# Patient Record
Sex: Male | Born: 1980 | Race: White | Hispanic: No | Marital: Single | Smoking: Current every day smoker
Health system: Southern US, Community
[De-identification: ages and names within clinical notes are randomized; demographics above are authoritative.]

## PROBLEM LIST (undated history)

## (undated) DIAGNOSIS — F4310 Post-traumatic stress disorder, unspecified: Secondary | ICD-10-CM

## (undated) DIAGNOSIS — F319 Bipolar disorder, unspecified: Secondary | ICD-10-CM

## (undated) DIAGNOSIS — G8929 Other chronic pain: Secondary | ICD-10-CM

## (undated) DIAGNOSIS — W3400XA Accidental discharge from unspecified firearms or gun, initial encounter: Secondary | ICD-10-CM

## (undated) DIAGNOSIS — F419 Anxiety disorder, unspecified: Secondary | ICD-10-CM

## (undated) DIAGNOSIS — R45851 Suicidal ideations: Secondary | ICD-10-CM

## (undated) DIAGNOSIS — M549 Dorsalgia, unspecified: Secondary | ICD-10-CM

## (undated) DIAGNOSIS — Y249XXA Unspecified firearm discharge, undetermined intent, initial encounter: Secondary | ICD-10-CM

## (undated) HISTORY — PX: BACK SURGERY: SHX140

## (undated) HISTORY — PX: ABDOMINAL SURGERY: SHX537

---

## 2002-03-25 ENCOUNTER — Emergency Department (HOSPITAL_COMMUNITY): Admission: EM | Admit: 2002-03-25 | Discharge: 2002-03-25 | Payer: Self-pay | Admitting: *Deleted

## 2003-01-28 ENCOUNTER — Emergency Department (HOSPITAL_COMMUNITY): Admission: EM | Admit: 2003-01-28 | Discharge: 2003-01-28 | Payer: Self-pay | Admitting: Emergency Medicine

## 2003-05-20 ENCOUNTER — Emergency Department (HOSPITAL_COMMUNITY): Admission: EM | Admit: 2003-05-20 | Discharge: 2003-05-20 | Payer: Self-pay | Admitting: Emergency Medicine

## 2003-07-07 ENCOUNTER — Emergency Department (HOSPITAL_COMMUNITY): Admission: EM | Admit: 2003-07-07 | Discharge: 2003-07-07 | Payer: Self-pay | Admitting: Emergency Medicine

## 2007-07-17 ENCOUNTER — Emergency Department (HOSPITAL_COMMUNITY): Admission: EM | Admit: 2007-07-17 | Discharge: 2007-07-17 | Payer: Self-pay | Admitting: Emergency Medicine

## 2007-09-28 ENCOUNTER — Emergency Department (HOSPITAL_COMMUNITY): Admission: EM | Admit: 2007-09-28 | Discharge: 2007-09-28 | Payer: Self-pay | Admitting: Emergency Medicine

## 2007-10-19 ENCOUNTER — Emergency Department (HOSPITAL_COMMUNITY): Admission: EM | Admit: 2007-10-19 | Discharge: 2007-10-19 | Payer: Self-pay | Admitting: Emergency Medicine

## 2010-08-16 ENCOUNTER — Emergency Department: Payer: Self-pay | Admitting: Emergency Medicine

## 2011-04-16 ENCOUNTER — Emergency Department (HOSPITAL_COMMUNITY)
Admission: EM | Admit: 2011-04-16 | Discharge: 2011-04-16 | Disposition: A | Discharge status: Home / Self Care | Payer: Self-pay | Attending: Emergency Medicine | Admitting: Emergency Medicine

## 2011-04-16 ENCOUNTER — Emergency Department (HOSPITAL_COMMUNITY): Payer: Self-pay

## 2011-04-16 ENCOUNTER — Encounter: Payer: Self-pay | Admitting: Emergency Medicine

## 2011-04-16 DIAGNOSIS — F17200 Nicotine dependence, unspecified, uncomplicated: Secondary | ICD-10-CM | POA: Insufficient documentation

## 2011-04-16 DIAGNOSIS — G5600 Carpal tunnel syndrome, unspecified upper limb: Secondary | ICD-10-CM | POA: Insufficient documentation

## 2011-04-16 MED ORDER — HYDROCODONE-ACETAMINOPHEN 5-325 MG PO TABS: 1.0000 | ORAL_TABLET | ORAL | Status: AC | PRN

## 2011-04-16 MED ORDER — NAPROXEN 500 MG PO TABS: 500.0000 mg | ORAL_TABLET | Freq: Two times a day (BID) | ORAL | Status: DC
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
## 2011-04-16 NOTE — ED Notes (Signed)
Pt states having tingling and numbness in right hand for three to four days. Pt works with hands a lot.

## 2011-04-16 NOTE — ED Notes (Signed)
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 Rt wrist with noted swelling and last 2 fingers blanching at times.  Pt states left wrist is bothersome at times however not at this time.

## 2011-04-26 NOTE — ED Provider Notes (Signed)
Medical screening examination/treatment/procedure(s) were performed by non-physician practitioner and as supervising physician I was immediately available for consultation/collaboration.  Shelda Jakes, MD 04/26/11 [phone]

## 2011-09-18 ENCOUNTER — Emergency Department (HOSPITAL_COMMUNITY)
Admission: EM | Admit: 2011-09-18 | Discharge: 2011-09-18 | Disposition: A | Discharge status: Home / Self Care | Payer: Self-pay | Attending: Emergency Medicine | Admitting: Emergency Medicine

## 2011-09-18 ENCOUNTER — Encounter (HOSPITAL_COMMUNITY): Payer: Self-pay

## 2011-09-18 ENCOUNTER — Emergency Department (HOSPITAL_COMMUNITY): Payer: Self-pay

## 2011-09-18 DIAGNOSIS — R109 Unspecified abdominal pain: Secondary | ICD-10-CM | POA: Insufficient documentation

## 2011-09-18 DIAGNOSIS — M545 Low back pain, unspecified: Secondary | ICD-10-CM | POA: Insufficient documentation

## 2011-09-18 DIAGNOSIS — Z9889 Other specified postprocedural states: Secondary | ICD-10-CM | POA: Insufficient documentation

## 2011-09-18 DIAGNOSIS — M549 Dorsalgia, unspecified: Secondary | ICD-10-CM

## 2011-09-18 DIAGNOSIS — F17200 Nicotine dependence, unspecified, uncomplicated: Secondary | ICD-10-CM | POA: Insufficient documentation

## 2011-09-18 DIAGNOSIS — G8929 Other chronic pain: Secondary | ICD-10-CM | POA: Insufficient documentation

## 2011-09-18 HISTORY — DX: Accidental discharge from unspecified firearms or gun, initial encounter: W34.00XA

## 2011-09-18 HISTORY — DX: Unspecified firearm discharge, undetermined intent, initial encounter: Y24.9XXA

## 2011-09-18 MED ORDER — OXYCODONE-ACETAMINOPHEN 5-325 MG PO TABS: ORAL_TABLET | ORAL | Status: DC

## 2011-09-18 MED ORDER — METHOCARBAMOL 500 MG PO TABS
1000.0000 mg | ORAL_TABLET | Freq: Once | ORAL | Status: AC
  Administered 2011-09-18: 1000 mg via ORAL
  Filled 2011-09-18: qty 2

## 2011-09-18 MED ORDER — OXYCODONE-ACETAMINOPHEN 5-325 MG PO TABS
2.0000 | ORAL_TABLET | Freq: Once | ORAL | Status: AC
  Administered 2011-09-18: 2 via ORAL
  Filled 2011-09-18: qty 2

## 2011-09-18 MED ORDER — METHOCARBAMOL 500 MG PO TABS: ORAL_TABLET | ORAL | Status: DC
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
## 2011-09-18 NOTE — Discharge Instructions (Signed)
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
## 2011-09-21 ENCOUNTER — Other Ambulatory Visit (HOSPITAL_COMMUNITY): Payer: Self-pay | Admitting: Emergency Medicine

## 2011-09-21 ENCOUNTER — Other Ambulatory Visit (HOSPITAL_COMMUNITY): Payer: Self-pay | Admitting: Diagnostic Radiology

## 2011-09-21 DIAGNOSIS — R52 Pain, unspecified: Secondary | ICD-10-CM

## 2012-04-07 ENCOUNTER — Emergency Department (HOSPITAL_COMMUNITY): Payer: Self-pay

## 2012-04-07 ENCOUNTER — Emergency Department (HOSPITAL_COMMUNITY)
Admission: EM | Admit: 2012-04-07 | Discharge: 2012-04-07 | Disposition: A | Discharge status: Home / Self Care | Payer: Self-pay | Attending: Emergency Medicine | Admitting: Emergency Medicine

## 2012-04-07 ENCOUNTER — Encounter (HOSPITAL_COMMUNITY): Payer: Self-pay | Admitting: Emergency Medicine

## 2012-04-07 DIAGNOSIS — F411 Generalized anxiety disorder: Secondary | ICD-10-CM | POA: Insufficient documentation

## 2012-04-07 DIAGNOSIS — Z888 Allergy status to other drugs, medicaments and biological substances status: Secondary | ICD-10-CM | POA: Insufficient documentation

## 2012-04-07 DIAGNOSIS — M549 Dorsalgia, unspecified: Secondary | ICD-10-CM | POA: Insufficient documentation

## 2012-04-07 DIAGNOSIS — F17200 Nicotine dependence, unspecified, uncomplicated: Secondary | ICD-10-CM | POA: Insufficient documentation

## 2012-04-07 DIAGNOSIS — Z880 Allergy status to penicillin: Secondary | ICD-10-CM | POA: Insufficient documentation

## 2012-04-07 DIAGNOSIS — R5381 Other malaise: Secondary | ICD-10-CM | POA: Insufficient documentation

## 2012-04-07 DIAGNOSIS — R5383 Other fatigue: Secondary | ICD-10-CM

## 2012-04-07 DIAGNOSIS — Z881 Allergy status to other antibiotic agents status: Secondary | ICD-10-CM | POA: Insufficient documentation

## 2012-04-07 HISTORY — DX: Anxiety disorder, unspecified: F41.9

## 2012-04-07 LAB — BASIC METABOLIC PANEL
BUN: 16 mg/dL (ref 6–23)
CO2: 25 mEq/L (ref 19–32)
Calcium: 9.7 mg/dL (ref 8.4–10.5)
Chloride: 101 mEq/L (ref 96–112)
Creatinine, Ser: 1.15 mg/dL (ref 0.50–1.35)
GFR calc Af Amer: >90 mL/min (ref >90)
GFR calc non Af Amer: 83 mL/min — ABNORMAL LOW (ref >90)
Glucose, Bld: 110 mg/dL — ABNORMAL HIGH (ref 70–99)
Potassium: 4.2 mEq/L (ref 3.5–5.1)
Sodium: 136 mEq/L (ref 135–145)

## 2012-04-07 LAB — CBC WITH DIFFERENTIAL/PLATELET
Basophils Absolute: 0.1 10*3/uL (ref 0.0–0.1)
Basophils Relative: 1 % (ref 0–1)
Eosinophils Absolute: 0.2 10*3/uL (ref 0.0–0.7)
Eosinophils Relative: 2 % (ref 0–5)
HCT: 47.9 % (ref 39.0–52.0)
Hemoglobin: 17.1 g/dL — ABNORMAL HIGH (ref 13.0–17.0)
Lymphocytes Relative: 26 % (ref 12–46)
Lymphs Abs: 2.7 10*3/uL (ref 0.7–4.0)
MCH: 32.3 pg (ref 26.0–34.0)
MCHC: 35.7 g/dL (ref 30.0–36.0)
MCV: 90.5 fL (ref 78.0–100.0)
Monocytes Absolute: 0.9 10*3/uL (ref 0.1–1.0)
Monocytes Relative: 9 % (ref 3–12)
Neutro Abs: 6.4 10*3/uL (ref 1.7–7.7)
Neutrophils Relative %: 63 % (ref 43–77)
Platelets: 264 10*3/uL (ref 150–400)
RBC: 5.29 MIL/uL (ref 4.22–5.81)
RDW: 13.3 % (ref 11.5–15.5)
WBC: 10.2 10*3/uL (ref 4.0–10.5)

## 2012-04-07 LAB — URINALYSIS, ROUTINE W REFLEX MICROSCOPIC
Bilirubin Urine: NEGATIVE
Glucose, UA: NEGATIVE mg/dL
Hgb urine dipstick: NEGATIVE
Ketones, ur: NEGATIVE mg/dL
Leukocytes, UA: NEGATIVE
Nitrite: NEGATIVE
Protein, ur: NEGATIVE mg/dL
Specific Gravity, Urine: 1.02 (ref 1.005–1.030)
Urobilinogen, UA: 0.2 mg/dL (ref 0.0–1.0)
pH: 6.5 (ref 5.0–8.0)
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
## 2012-04-07 NOTE — ED Provider Notes (Signed)
Medical screening examination/treatment/procedure(s) were performed by non-physician practitioner and as supervising physician I was immediately available for consultation/collaboration.   Assyria Morreale W Ruthann Angulo, MD 04/07/12 1630 
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
## 2013-01-12 ENCOUNTER — Emergency Department (HOSPITAL_COMMUNITY): Payer: Self-pay

## 2013-01-12 ENCOUNTER — Encounter (HOSPITAL_COMMUNITY): Payer: Self-pay | Admitting: Emergency Medicine

## 2013-01-12 ENCOUNTER — Emergency Department (HOSPITAL_COMMUNITY)
Admission: EM | Admit: 2013-01-12 | Discharge: 2013-01-12 | Disposition: A | Discharge status: Home / Self Care | Payer: Self-pay | Attending: Emergency Medicine | Admitting: Emergency Medicine

## 2013-01-12 DIAGNOSIS — X500XXA Overexertion from strenuous movement or load, initial encounter: Secondary | ICD-10-CM | POA: Insufficient documentation

## 2013-01-12 DIAGNOSIS — F4310 Post-traumatic stress disorder, unspecified: Secondary | ICD-10-CM | POA: Insufficient documentation

## 2013-01-12 DIAGNOSIS — Y9289 Other specified places as the place of occurrence of the external cause: Secondary | ICD-10-CM | POA: Insufficient documentation

## 2013-01-12 DIAGNOSIS — G8929 Other chronic pain: Secondary | ICD-10-CM | POA: Insufficient documentation

## 2013-01-12 DIAGNOSIS — F17200 Nicotine dependence, unspecified, uncomplicated: Secondary | ICD-10-CM | POA: Insufficient documentation

## 2013-01-12 DIAGNOSIS — Z87828 Personal history of other (healed) physical injury and trauma: Secondary | ICD-10-CM | POA: Insufficient documentation

## 2013-01-12 DIAGNOSIS — Z9889 Other specified postprocedural states: Secondary | ICD-10-CM | POA: Insufficient documentation

## 2013-01-12 DIAGNOSIS — Z880 Allergy status to penicillin: Secondary | ICD-10-CM | POA: Insufficient documentation

## 2013-01-12 DIAGNOSIS — T148XXA Other injury of unspecified body region, initial encounter: Secondary | ICD-10-CM

## 2013-01-12 DIAGNOSIS — Y9389 Activity, other specified: Secondary | ICD-10-CM | POA: Insufficient documentation

## 2013-01-12 DIAGNOSIS — Z79899 Other long term (current) drug therapy: Secondary | ICD-10-CM | POA: Insufficient documentation

## 2013-01-12 DIAGNOSIS — S239XXA Sprain of unspecified parts of thorax, initial encounter: Secondary | ICD-10-CM | POA: Insufficient documentation

## 2013-01-12 DIAGNOSIS — F411 Generalized anxiety disorder: Secondary | ICD-10-CM | POA: Insufficient documentation

## 2013-01-12 HISTORY — DX: Post-traumatic stress disorder, unspecified: F43.10

## 2013-01-12 HISTORY — DX: Dorsalgia, unspecified: M54.9

## 2013-01-12 HISTORY — DX: Other chronic pain: G89.29

## 2013-01-12 MED ORDER — DIAZEPAM 5 MG PO TABS
5.0000 mg | ORAL_TABLET | Freq: Once | ORAL | Status: AC
  Administered 2013-01-12: 5 mg via ORAL
  Filled 2013-01-12: qty 1

## 2013-01-12 MED ORDER — OXYCODONE-ACETAMINOPHEN 5-325 MG PO TABS: ORAL_TABLET | ORAL | Status: DC

## 2013-01-12 MED ORDER — METHOCARBAMOL 500 MG PO TABS: 1000.0000 mg | ORAL_TABLET | Freq: Four times a day (QID) | ORAL | Status: DC | PRN

## 2013-01-12 MED ORDER — NAPROXEN 250 MG PO TABS: 250.0000 mg | ORAL_TABLET | Freq: Two times a day (BID) | ORAL | Status: DC

## 2013-01-12 MED ORDER — HYDROMORPHONE HCL PF 1 MG/ML IJ SOLN
1.0000 mg | Freq: Once | INTRAMUSCULAR | Status: AC
  Administered 2013-01-12: 1 mg via INTRAMUSCULAR
  Filled 2013-01-12: qty 1

## 2013-01-12 NOTE — ED Notes (Signed)
Pain T spine region, Onset on Tuesday when moving a 45lbs flower pot . Pt shakey
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
## 2013-05-26 ENCOUNTER — Other Ambulatory Visit (HOSPITAL_COMMUNITY): Payer: Self-pay | Admitting: Family Medicine

## 2013-05-26 ENCOUNTER — Ambulatory Visit (HOSPITAL_COMMUNITY)
Admission: RE | Admit: 2013-05-26 | Discharge: 2013-05-26 | Disposition: A | Discharge status: Home / Self Care | Payer: Disability Insurance | Source: Ambulatory Visit | Attending: Family Medicine | Admitting: Family Medicine

## 2013-05-26 DIAGNOSIS — M542 Cervicalgia: Secondary | ICD-10-CM

## 2013-05-26 DIAGNOSIS — M25559 Pain in unspecified hip: Secondary | ICD-10-CM | POA: Insufficient documentation

## 2013-05-26 DIAGNOSIS — M545 Low back pain, unspecified: Secondary | ICD-10-CM | POA: Insufficient documentation

## 2013-05-26 DIAGNOSIS — M5137 Other intervertebral disc degeneration, lumbosacral region: Secondary | ICD-10-CM | POA: Insufficient documentation

## 2013-05-26 DIAGNOSIS — M51379 Other intervertebral disc degeneration, lumbosacral region without mention of lumbar back pain or lower extremity pain: Secondary | ICD-10-CM | POA: Insufficient documentation

## 2013-07-08 ENCOUNTER — Inpatient Hospital Stay (HOSPITAL_COMMUNITY)
Admission: AD | Admit: 2013-07-08 | Discharge: 2013-07-14 | DRG: 885 | Disposition: A | Discharge status: Home / Self Care | Payer: 59 | Source: Intra-hospital | Attending: Psychiatry | Admitting: Psychiatry

## 2013-07-08 ENCOUNTER — Encounter (HOSPITAL_COMMUNITY): Payer: Self-pay | Admitting: Emergency Medicine

## 2013-07-08 ENCOUNTER — Emergency Department (HOSPITAL_COMMUNITY)
Admission: EM | Admit: 2013-07-08 | Discharge: 2013-07-08 | Disposition: A | Discharge status: Other Acute Inpatient Hospital | Payer: Self-pay | Attending: Emergency Medicine | Admitting: Emergency Medicine

## 2013-07-08 ENCOUNTER — Encounter (HOSPITAL_COMMUNITY): Payer: Self-pay

## 2013-07-08 DIAGNOSIS — G4700 Insomnia, unspecified: Secondary | ICD-10-CM | POA: Diagnosis present

## 2013-07-08 DIAGNOSIS — F4310 Post-traumatic stress disorder, unspecified: Secondary | ICD-10-CM | POA: Insufficient documentation

## 2013-07-08 DIAGNOSIS — F10939 Alcohol use, unspecified with withdrawal, unspecified: Secondary | ICD-10-CM | POA: Diagnosis present

## 2013-07-08 DIAGNOSIS — F1910 Other psychoactive substance abuse, uncomplicated: Secondary | ICD-10-CM

## 2013-07-08 DIAGNOSIS — F1210 Cannabis abuse, uncomplicated: Secondary | ICD-10-CM | POA: Diagnosis present

## 2013-07-08 DIAGNOSIS — Z791 Long term (current) use of non-steroidal anti-inflammatories (NSAID): Secondary | ICD-10-CM | POA: Insufficient documentation

## 2013-07-08 DIAGNOSIS — R45851 Suicidal ideations: Secondary | ICD-10-CM | POA: Insufficient documentation

## 2013-07-08 DIAGNOSIS — F1010 Alcohol abuse, uncomplicated: Secondary | ICD-10-CM | POA: Diagnosis present

## 2013-07-08 DIAGNOSIS — Z87828 Personal history of other (healed) physical injury and trauma: Secondary | ICD-10-CM | POA: Insufficient documentation

## 2013-07-08 DIAGNOSIS — G8929 Other chronic pain: Secondary | ICD-10-CM | POA: Insufficient documentation

## 2013-07-08 DIAGNOSIS — F329 Major depressive disorder, single episode, unspecified: Secondary | ICD-10-CM | POA: Insufficient documentation

## 2013-07-08 DIAGNOSIS — Z79899 Other long term (current) drug therapy: Secondary | ICD-10-CM | POA: Insufficient documentation

## 2013-07-08 DIAGNOSIS — F319 Bipolar disorder, unspecified: Secondary | ICD-10-CM | POA: Diagnosis not present

## 2013-07-08 DIAGNOSIS — F3289 Other specified depressive episodes: Secondary | ICD-10-CM | POA: Insufficient documentation

## 2013-07-08 DIAGNOSIS — F411 Generalized anxiety disorder: Secondary | ICD-10-CM | POA: Insufficient documentation

## 2013-07-08 DIAGNOSIS — F1310 Sedative, hypnotic or anxiolytic abuse, uncomplicated: Secondary | ICD-10-CM | POA: Diagnosis present

## 2013-07-08 DIAGNOSIS — F332 Major depressive disorder, recurrent severe without psychotic features: Principal | ICD-10-CM | POA: Diagnosis present

## 2013-07-08 DIAGNOSIS — F10239 Alcohol dependence with withdrawal, unspecified: Secondary | ICD-10-CM | POA: Diagnosis present

## 2013-07-08 DIAGNOSIS — Z880 Allergy status to penicillin: Secondary | ICD-10-CM | POA: Insufficient documentation

## 2013-07-08 DIAGNOSIS — F17200 Nicotine dependence, unspecified, uncomplicated: Secondary | ICD-10-CM | POA: Insufficient documentation

## 2013-07-08 DIAGNOSIS — F10929 Alcohol use, unspecified with intoxication, unspecified: Secondary | ICD-10-CM

## 2013-07-08 DIAGNOSIS — F32A Depression, unspecified: Secondary | ICD-10-CM

## 2013-07-08 DIAGNOSIS — F1020 Alcohol dependence, uncomplicated: Secondary | ICD-10-CM | POA: Diagnosis present

## 2013-07-08 HISTORY — DX: Bipolar disorder, unspecified: F31.9

## 2013-07-08 LAB — ACETAMINOPHEN LEVEL: Acetaminophen (Tylenol), Serum: <15 ug/mL (ref 10–30)

## 2013-07-08 LAB — ETHANOL: ALCOHOL ETHYL (B): 141 mg/dL — AB (ref 0–11)

## 2013-07-08 LAB — CBC WITH DIFFERENTIAL/PLATELET
BASOS ABS: 0.1 10*3/uL (ref 0.0–0.1)
BASOS PCT: 1 % (ref 0–1)
EOS ABS: 0.3 10*3/uL (ref 0.0–0.7)
EOS PCT: 3 % (ref 0–5)
HEMATOCRIT: 45.4 % (ref 39.0–52.0)
Hemoglobin: 16 g/dL (ref 13.0–17.0)
Lymphocytes Relative: 32 % (ref 12–46)
Lymphs Abs: 3.1 10*3/uL (ref 0.7–4.0)
MCH: 32.7 pg (ref 26.0–34.0)
MCHC: 35.2 g/dL (ref 30.0–36.0)
MCV: 92.7 fL (ref 78.0–100.0)
MONO ABS: 0.9 10*3/uL (ref 0.1–1.0)
Monocytes Relative: 9 % (ref 3–12)
Neutro Abs: 5.5 10*3/uL (ref 1.7–7.7)
Neutrophils Relative %: 57 % (ref 43–77)
PLATELETS: 247 10*3/uL (ref 150–400)
RBC: 4.9 MIL/uL (ref 4.22–5.81)
RDW: 13.7 % (ref 11.5–15.5)
WBC: 9.7 10*3/uL (ref 4.0–10.5)

## 2013-07-08 LAB — COMPREHENSIVE METABOLIC PANEL
ALT: 104 U/L — AB (ref 0–53)
AST: 112 U/L — AB (ref 0–37)
Albumin: 4.1 g/dL (ref 3.5–5.2)
Alkaline Phosphatase: 84 U/L (ref 39–117)
BUN: 10 mg/dL (ref 6–23)
CALCIUM: 9.1 mg/dL (ref 8.4–10.5)
CO2: 26 mEq/L (ref 19–32)
CREATININE: 1.07 mg/dL (ref 0.50–1.35)
Chloride: 105 mEq/L (ref 96–112)
GFR calc Af Amer: >90 mL/min (ref >90)
GFR, EST NON AFRICAN AMERICAN: 90 mL/min — AB (ref >90)
Glucose, Bld: 99 mg/dL (ref 70–99)
Potassium: 3.7 mEq/L (ref 3.7–5.3)
SODIUM: 141 meq/L (ref 137–147)
TOTAL PROTEIN: 7.5 g/dL (ref 6.0–8.3)
Total Bilirubin: 0.2 mg/dL — ABNORMAL LOW (ref 0.3–1.2)

## 2013-07-08 LAB — RAPID URINE DRUG SCREEN, HOSP PERFORMED
AMPHETAMINES: NOT DETECTED
BENZODIAZEPINES: POSITIVE — AB
Barbiturates: NOT DETECTED
COCAINE: NOT DETECTED
Opiates: NOT DETECTED
Tetrahydrocannabinol: POSITIVE — AB

## 2013-07-08 LAB — SALICYLATE LEVEL: Salicylate Lvl: <2 mg/dL — ABNORMAL LOW (ref 2.8–20.0)

## 2013-07-08 MED ORDER — CHLORDIAZEPOXIDE HCL 25 MG PO CAPS: 25.0000 mg | ORAL_CAPSULE | Freq: Three times a day (TID) | ORAL | Status: DC

## 2013-07-08 MED ORDER — ADULT MULTIVITAMIN W/MINERALS CH
1.0000 | ORAL_TABLET | Freq: Every day | ORAL | Status: DC
  Administered 2013-07-08 – 2013-07-14 (×7): 1 via ORAL
  Filled 2013-07-08 (×10): qty 1

## 2013-07-08 MED ORDER — SERTRALINE HCL 100 MG PO TABS
200.0000 mg | ORAL_TABLET | Freq: Every day | ORAL | Status: DC
  Administered 2013-07-08 – 2013-07-14 (×7): 200 mg via ORAL
  Filled 2013-07-08: qty 28
  Filled 2013-07-08 (×9): qty 2

## 2013-07-08 MED ORDER — VITAMIN B-1 100 MG PO TABS
100.0000 mg | ORAL_TABLET | Freq: Every day | ORAL | Status: DC
  Administered 2013-07-09 – 2013-07-14 (×6): 100 mg via ORAL
  Filled 2013-07-08 (×8): qty 1

## 2013-07-08 MED ORDER — CHLORDIAZEPOXIDE HCL 25 MG PO CAPS: 25.0000 mg | ORAL_CAPSULE | Freq: Once | ORAL | Status: DC

## 2013-07-08 MED ORDER — THIAMINE HCL 100 MG/ML IJ SOLN
100.0000 mg | Freq: Once | INTRAMUSCULAR | Status: DC
Start: 2013-07-08 — End: 2013-07-14
  Filled 2013-07-08: qty 2

## 2013-07-08 MED ORDER — CHLORDIAZEPOXIDE HCL 25 MG PO CAPS: 25.0000 mg | ORAL_CAPSULE | Freq: Four times a day (QID) | ORAL | Status: DC | PRN

## 2013-07-08 MED ORDER — ONDANSETRON HCL 4 MG PO TABS: 4.0000 mg | ORAL_TABLET | Freq: Three times a day (TID) | ORAL | Status: DC | PRN

## 2013-07-08 MED ORDER — CHLORDIAZEPOXIDE HCL 25 MG PO CAPS: 25.0000 mg | ORAL_CAPSULE | Freq: Every day | ORAL | Status: DC

## 2013-07-08 MED ORDER — METHOCARBAMOL 500 MG PO TABS
1000.0000 mg | ORAL_TABLET | Freq: Four times a day (QID) | ORAL | Status: DC | PRN
  Administered 2013-07-09 – 2013-07-13 (×2): 1000 mg via ORAL
  Filled 2013-07-08 (×2): qty 2

## 2013-07-08 MED ORDER — LORAZEPAM 1 MG PO TABS
1.0000 mg | ORAL_TABLET | Freq: Two times a day (BID) | ORAL | Status: AC
  Administered 2013-07-10: 1 mg via ORAL
  Filled 2013-07-08 (×2): qty 1

## 2013-07-08 MED ORDER — NAPROXEN 250 MG PO TABS
250.0000 mg | ORAL_TABLET | Freq: Two times a day (BID) | ORAL | Status: DC
  Administered 2013-07-08 – 2013-07-14 (×13): 250 mg via ORAL
  Filled 2013-07-08 (×18): qty 1

## 2013-07-08 MED ORDER — CHLORDIAZEPOXIDE HCL 25 MG PO CAPS: 25.0000 mg | ORAL_CAPSULE | ORAL | Status: DC

## 2013-07-08 MED ORDER — CHLORDIAZEPOXIDE HCL 25 MG PO CAPS: 25.0000 mg | ORAL_CAPSULE | Freq: Four times a day (QID) | ORAL | Status: DC

## 2013-07-08 MED ORDER — VITAMIN B-1 100 MG PO TABS: 100.0000 mg | ORAL_TABLET | Freq: Every day | ORAL | Status: DC

## 2013-07-08 MED ORDER — LORAZEPAM 1 MG PO TABS
1.0000 mg | ORAL_TABLET | Freq: Four times a day (QID) | ORAL | Status: DC | PRN
  Administered 2013-07-10: 1 mg via ORAL
  Filled 2013-07-08: qty 1

## 2013-07-08 MED ORDER — ACETAMINOPHEN 325 MG PO TABS: 650.0000 mg | ORAL_TABLET | Freq: Four times a day (QID) | ORAL | Status: DC | PRN

## 2013-07-08 MED ORDER — HYDROXYZINE HCL 25 MG PO TABS
25.0000 mg | ORAL_TABLET | Freq: Four times a day (QID) | ORAL | Status: AC | PRN
  Administered 2013-07-09: 25 mg via ORAL
  Filled 2013-07-08 (×2): qty 1

## 2013-07-08 MED ORDER — LORAZEPAM 1 MG PO TABS: 0.0000 mg | ORAL_TABLET | Freq: Two times a day (BID) | ORAL | Status: DC

## 2013-07-08 MED ORDER — LORAZEPAM 1 MG PO TABS: 0.0000 mg | ORAL_TABLET | Freq: Four times a day (QID) | ORAL | Status: DC

## 2013-07-08 MED ORDER — ONDANSETRON 4 MG PO TBDP: 4.0000 mg | ORAL_TABLET | Freq: Four times a day (QID) | ORAL | Status: AC | PRN

## 2013-07-08 MED ORDER — LORAZEPAM 1 MG PO TABS
1.0000 mg | ORAL_TABLET | Freq: Three times a day (TID) | ORAL | Status: AC
  Administered 2013-07-09 (×3): 1 mg via ORAL
  Filled 2013-07-08 (×3): qty 1

## 2013-07-08 MED ORDER — RISPERIDONE 1 MG PO TABS
1.5000 mg | ORAL_TABLET | Freq: Two times a day (BID) | ORAL | Status: DC
  Administered 2013-07-08 – 2013-07-09 (×3): 1.5 mg via ORAL
  Filled 2013-07-08 (×7): qty 1

## 2013-07-08 MED ORDER — LORAZEPAM 1 MG PO TABS
1.0000 mg | ORAL_TABLET | Freq: Four times a day (QID) | ORAL | Status: AC
  Administered 2013-07-08 (×3): 1 mg via ORAL
  Filled 2013-07-08 (×3): qty 1

## 2013-07-08 MED ORDER — NICOTINE 21 MG/24HR TD PT24: 21.0000 mg | MEDICATED_PATCH | Freq: Every day | TRANSDERMAL | Status: DC

## 2013-07-08 MED ORDER — PANTOPRAZOLE SODIUM 20 MG PO TBEC
20.0000 mg | DELAYED_RELEASE_TABLET | Freq: Every day | ORAL | Status: DC
  Administered 2013-07-09 – 2013-07-14 (×6): 20 mg via ORAL
  Filled 2013-07-08 (×10): qty 1

## 2013-07-08 MED ORDER — MAGNESIUM HYDROXIDE 400 MG/5ML PO SUSP: 30.0000 mL | Freq: Every day | ORAL | Status: DC | PRN

## 2013-07-08 MED ORDER — GABAPENTIN 400 MG PO CAPS
400.0000 mg | ORAL_CAPSULE | Freq: Three times a day (TID) | ORAL | Status: DC
  Administered 2013-07-08 – 2013-07-14 (×20): 400 mg via ORAL
  Filled 2013-07-08 (×8): qty 1
  Filled 2013-07-08: qty 42
  Filled 2013-07-08 (×11): qty 1
  Filled 2013-07-08: qty 42
  Filled 2013-07-08 (×4): qty 1
  Filled 2013-07-08: qty 42
  Filled 2013-07-08 (×3): qty 1

## 2013-07-08 MED ORDER — LOPERAMIDE HCL 2 MG PO CAPS: 2.0000 mg | ORAL_CAPSULE | ORAL | Status: AC | PRN

## 2013-07-08 MED ORDER — THIAMINE HCL 100 MG/ML IJ SOLN: 100.0000 mg | Freq: Every day | INTRAMUSCULAR | Status: DC

## 2013-07-08 MED ORDER — LORAZEPAM 1 MG PO TABS
1.0000 mg | ORAL_TABLET | Freq: Every day | ORAL | Status: AC
  Administered 2013-07-11: 1 mg via ORAL

## 2013-07-08 NOTE — [facility] Group Notes (Signed)
[facility] Group Notes:  (Nursing/MHT/Case Management/Adjunct)  Date:  07/08/2013  Time:  2:43 PM  Type of Therapy:  Psychoeducational Skills  Participation Level:  Did Not Attend  Participation Quality:  na  Affect:  na  Cognitive:  na  Insight:  None  Engagement in Group:  na  Modes of Intervention:  na  Summary of Progress/Problems:healthy coping skill review with RN. Focus on replacing unhealthy coping skills and using healthy coping with stressors.   Malva LimesStrader, Bambi Fehnel 07/08/2013, 2:43 PM

## 2013-07-08 NOTE — [facility] Group Notes (Signed)
[facility] Group Notes: (Clinical Social Work)   07/08/2013      Type of Therapy:  Group Therapy   Participation Level:  Did Not Attend    Vin Yonke Grossman-Orr, LCSW 07/08/2013, 12:01 PM     

## 2013-07-08 NOTE — Progress Notes (Signed)
Patient ID: Darrell PortsJames Lesko, male   DOB: 03-Sep-1980, 33 y.o.   MRN: [id number] D. The patient most of the evening resting in bed with eyes closed. Came out of room briefly to get something to drink and a snack. Returned to bed after his snack. Anxious mood and affect. A. Encouraged to attend evening group. Reviewed and administered HS medication. R. The patient did not attend evening group. Stated that he was not feeling well. Compliant with medication.
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
## 2013-07-08 NOTE — Progress Notes (Deleted)
Patient ID: Darrell PortsJames Espinoza, male   DOB: April 20, 1981, 33 y.o.   MRN: [id number] 1:1 Nursing Note: The patient was bright and social this evening. He interacted well in the milieu. Attended and actively participated in evening wrap up group. When discussing recovery plans he identified his girlfriend, who is his payee, as his support system. Lacks knowledge regarding addiction and need for a support system after discharge. 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 Encouraged to speak with his doctor and social worker regarding discharge plans and what services are available to him to help him remain substance free. 1:1 maintained for safety due to being a high fall risk. Will continue to monitor.
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
## 2013-07-08 NOTE — ED Notes (Signed)
Pt escorted out to transport van. Pt belongings given transport driver w/ paperwork.

## 2013-07-08 NOTE — Progress Notes (Signed)
Patient ID: Darrell PortsJames Espinoza, male   DOB: 02/08/1981, 33 y.o.   MRN: [id number] D. Patient presents with irritable , anxious mood, affect congruent. Patient states '' I knew I needed to come I don't know why a person has to be searched so many times, if I wanted to do it I would've already done it. '' Patient reports increasing depression with family stressors, and anxiety. Patient requesting '' I need my klonopin '' . A. Discussed above information with Molli KnockJ. Lord NP. Medications given as ordered. Support and encouragement provided. R. Patient has remained isolative to room throughout most of am shift. Pt has not attended unit programming. No further voiced concerns at this time. Will continue to monitor q 15 minutes for safety.

## 2013-07-08 NOTE — ED Notes (Signed)
Pt mother phone number, [phone]  Darrell Espinoza
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
## 2013-07-08 NOTE — ED Notes (Signed)
Pt reports a long history of mental issues, pt spoke w/ his counselor & was told to come to the ER. Pt states he has had family issues he was a child. Pt states when he does take medications he does not feel any better. Pt states he has no one that cares for him & finding it "hard to live & just wants it to be over with." pt crying during triage while talking about his condition.

## 2013-07-08 NOTE — [facility] Group Notes (Signed)
[facility] Group Notes:  (Nursing/MHT/Case Management/Adjunct)  Date:  07/08/2013  Time:  11:19 AM  Type of Therapy:  Psychoeducational Skills  Participation Level:  Did Not Attend  Participation Quality:  na  Affect:  na  Cognitive:  na  Insight:  None  Engagement in Group:  na  Modes of Intervention:  na  Summary of Progress/Problems: Pt did not attend despite encouragement   Malva LimesStrader, Tajha Sammarco 07/08/2013, 11:19 AM

## 2013-07-08 NOTE — ED Notes (Signed)
Pt woke from sleep to sign paperwork.

## 2013-07-08 NOTE — [facility] Suicide Risk Assessment (Signed)
Suicide Risk Assessment  Admission Assessment     Nursing information obtained from:    Demographic factors:    Current Mental Status:    Loss Factors:    Historical Factors:    Risk Reduction Factors:     CLINICAL FACTORS:   Bipolar Disorder:   Mixed State Alcohol/Substance Abuse/Dependencies  COGNITIVE FEATURES THAT CONTRIBUTE TO RISK:  Closed-mindedness Polarized thinking Thought constriction (tunnel vision)    SUICIDE RISK:   Moderate:  Frequent suicidal ideation with limited intensity, and duration, some specificity in terms of plans, no associated intent, good self-control, limited dysphoria/symptomatology, some risk factors present, and identifiable protective factors, including available and accessible social support.  PLAN OF CARE: Supportive approach/coping skills/relapse prevention                               Detox as needed                               Reassess and address the co morbidities  I certify that inpatient services furnished can reasonably be expected to improve the patient's condition.  Quamel Fitzmaurice A 07/08/2013, 5:53 PM

## 2013-07-09 DIAGNOSIS — F319 Bipolar disorder, unspecified: Secondary | ICD-10-CM

## 2013-07-09 MED ORDER — RAMELTEON 8 MG PO TABS
8.0000 mg | ORAL_TABLET | Freq: Every day | ORAL | Status: DC
  Administered 2013-07-09 – 2013-07-11 (×3): 8 mg via ORAL
  Filled 2013-07-09 (×5): qty 1

## 2013-07-09 MED ORDER — RISPERIDONE 2 MG PO TABS
2.0000 mg | ORAL_TABLET | Freq: Two times a day (BID) | ORAL | Status: DC
  Administered 2013-07-09 – 2013-07-14 (×10): 2 mg via ORAL
  Filled 2013-07-09: qty 28
  Filled 2013-07-09 (×7): qty 1
  Filled 2013-07-09: qty 28
  Filled 2013-07-09 (×5): qty 1

## 2013-07-09 MED ORDER — NICOTINE 21 MG/24HR TD PT24
21.0000 mg | MEDICATED_PATCH | Freq: Every day | TRANSDERMAL | Status: DC
  Administered 2013-07-09 – 2013-07-13 (×5): 21 mg via TRANSDERMAL
  Filled 2013-07-09 (×9): qty 1

## 2013-07-09 MED ORDER — TRAZODONE HCL 50 MG PO TABS
50.0000 mg | ORAL_TABLET | Freq: Every evening | ORAL | Status: DC | PRN
Start: 2013-07-09 — End: 2013-07-09
  Filled 2013-07-09: qty 1

## 2013-07-09 MED ORDER — BENZTROPINE MESYLATE 0.5 MG PO TABS
0.5000 mg | ORAL_TABLET | Freq: Two times a day (BID) | ORAL | Status: DC
  Administered 2013-07-09 – 2013-07-14 (×10): 0.5 mg via ORAL
  Filled 2013-07-09 (×6): qty 1
  Filled 2013-07-09: qty 28
  Filled 2013-07-09 (×7): qty 1
  Filled 2013-07-09: qty 28
  Filled 2013-07-09: qty 1

## 2013-07-09 NOTE — Progress Notes (Signed)
Adult Psychoeducational Group Note  Date:  07/09/2013 Time:  8:00 pm  Group Topic/Focus:  Wrap-Up Group:   The focus of this group is to help patients review their daily goal of treatment and discuss progress on daily workbooks.  Participation Level:  Did Not Attend   Modena NunneryJohnson, Jakhia Buxton 07/09/2013, 10:33 PM

## 2013-07-09 NOTE — [facility] Group Notes (Signed)
[facility] Group Notes:  (Clinical Social Work)  07/09/2013   11:15am-12:00pm  Summary of Progress/Problems:  The main focus of today's process group was to listen to a variety of genres of music and to identify that different types of music provoke different responses.  The patient then was able to identify personally what was soothing for them, as well as energizing.  Handouts were used to record feelings evoked, as well as how patient can personally use this knowledge in sleep habits, with depression, and with other symptoms.  The patient expressed understanding of concepts, as well as knowledge of how each type of music affected them and how this can be used when they are at home as a tool in their recovery.  Type of Therapy:  Music Therapy   Participation Level:  Active  Participation Quality:  Attentive and Sharing  Affect:  Blunted  Cognitive:  Oriented  Insight:  Engaged  Engagement in Therapy:  Engaged  Modes of Intervention:   Activity, Exploration  Clark Cuff Grossman-Orr, LCSW 07/09/2013, 12:30pm    

## 2013-07-09 NOTE — [facility] Group Notes (Signed)
[facility] Group Notes:  (Nursing/MHT/Case Management/Adjunct)  Date:  07/09/2013  Time:  2:34 PM  Type of Therapy:  Psychoeducational Skills  Participation Level:  Active  Participation Quality:  Appropriate  Affect:  Anxious  Cognitive:  Appropriate  Insight:  Improving  Engagement in Group:  Engaged  Modes of Intervention:  Discussion, Education and Exploration  Summary of Progress/Problems: psycho-educational review with RN- focus on healthy coping and healthy support systems.   Darrell Espinoza, Darrell Espinoza 07/09/2013, 2:34 PM

## 2013-07-09 NOTE — [facility] Group Notes (Signed)
[facility] Group Notes:  (Nursing/MHT/Case Management/Adjunct)  Date:  07/09/2013  Time:  9:55 AM  Type of Therapy:  Psychoeducational Skills  Participation Level:  Did Not Attend  Participation Quality:  na  Affect:  na  Cognitive:  na  Insight:  None  Engagement in Group:  na  Modes of Intervention:  na  Summary of Progress/Problems:  Malva LimesStrader, Hlee Fringer 07/09/2013, 9:55 AM
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
## 2013-07-09 NOTE — Progress Notes (Signed)
Patient ID: Darrell Espinoza, male   DOB: 09-05-1980, 33 y.o.   MRN: [id number] D. Patient presents with depressed and anxious mood, affect congruent. Patient continues to be very focused on klonopin, and his anxiety stating '' I just keep having panic attacks, so I'll have to stay in the bed all day because that's the only thing that helps besides my medicine. You need to tell the doctor I need to be back on my klonopin. It works better for me, the panic attacks make me shake and chest get tight and I feel better on klonopin '' Patient has been isolative to room throughout rest of shift, in bed resting quietly. He denies any SI/HI/ A/V Hallucinations. A. Discussed above information with L. Earlene Plateravis NP. Medications given as ordered. Support and encouragement provided. R. Patient has remained isolative to room throughout most of am shift. Pt has not attended unit programming. No further voiced concerns at this time. Will continue to monitor q 15 minutes for safety.

## 2013-07-09 NOTE — Progress Notes (Signed)
Patient ID: Darrell PortsJames Espinoza, male   DOB: 03-30-1981, 33 y.o.   MRN: [id number]  D: Pt. Denies SI/HI and A/V Hallucinations. Patient does report pain in his tooth but states that nothing helps and refused intervention.   A: Support and encouragement provided to the patient.   R: Patient is receptive and cooperative. Patient was sleeping earlier in the evening but is now complaining of not being able to sleep. NP was notified. Q15 minute checks are maintained for safety.

## 2013-07-10 DIAGNOSIS — F1010 Alcohol abuse, uncomplicated: Secondary | ICD-10-CM

## 2013-07-10 NOTE — Progress Notes (Signed)
Adult Psychoeducational Group Note  Date:  07/10/2013 Time:  1000 am  Group Topic/Focus:  Wellness Toolbox:   The focus of this group is to discuss various aspects of wellness, balancing those aspects and exploring ways to increase the ability to experience wellness.  Patients will create a wellness toolbox for use upon discharge.  Participation Level:  Did Not Attend   Additional Comments:   Cranford MonBeaudry, Pallas Wahlert Evans 07/10/2013, 2:32 PM
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
## 2013-07-10 NOTE — Progress Notes (Signed)
D: Pt presents with flat affect and depressed mood. Pt reports feeling depressed d/t life being stressful. Pt reports feeling anxious and reported that it was d/t him being around a lot of people in the cafeteria. Pt given prn ativan as requested. Pt compliant with taking meds and attending groups. A: medications administered as ordered per MD. Verbal support given. Pt encouraged to attend groups. 15 minute checks performed for safety. R: Pt safety maintained.
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
## 2013-07-10 NOTE — [facility] Group Notes (Signed)
[facility]   07/10/2013 8:18 AM  Participation Quality:  Engaged  Mood/Affect:  Depressed  Depression Rating:  10  Anxiety Rating:  7  Thoughts of Suicide:  No Will you contract for safety?   NA  Current AVH:  No  Plan for Discharge/Comments:  Darrell Espinoza states he is here due to increased SI and HI as a result of not seeing his daughter in 4 years.  [place]  Was last in an inpt setting last Christmas.  Staying with his mother.  "She's half the reason I am here.  She has problems like I do."  Admitted to using marijuana, but quickly added "don't treat me like I am a drug addict like everyone else does."  UDS positive for alcohol, cannabis and benzodiazapines.  Transportation Means: unk  Supports: mother  Ida Rogueorth, Leighton Brickley B

## 2013-07-11 DIAGNOSIS — F332 Major depressive disorder, recurrent severe without psychotic features: Principal | ICD-10-CM

## 2013-07-11 DIAGNOSIS — F1310 Sedative, hypnotic or anxiolytic abuse, uncomplicated: Secondary | ICD-10-CM

## 2013-07-11 DIAGNOSIS — F4310 Post-traumatic stress disorder, unspecified: Secondary | ICD-10-CM

## 2013-07-11 DIAGNOSIS — F10239 Alcohol dependence with withdrawal, unspecified: Secondary | ICD-10-CM

## 2013-07-11 DIAGNOSIS — F10939 Alcohol use, unspecified with withdrawal, unspecified: Secondary | ICD-10-CM

## 2013-07-11 LAB — COMPREHENSIVE METABOLIC PANEL
ALBUMIN: 4 g/dL (ref 3.5–5.2)
ALK PHOS: 86 U/L (ref 39–117)
ALT: 67 U/L — AB (ref 0–53)
AST: 39 U/L — AB (ref 0–37)
BILIRUBIN TOTAL: 0.3 mg/dL (ref 0.3–1.2)
BUN: 19 mg/dL (ref 6–23)
CO2: 28 meq/L (ref 19–32)
Calcium: 9.3 mg/dL (ref 8.4–10.5)
Chloride: 98 mEq/L (ref 96–112)
Creatinine, Ser: 1.15 mg/dL (ref 0.50–1.35)
GFR calc Af Amer: >90 mL/min (ref >90)
GFR, EST NON AFRICAN AMERICAN: 82 mL/min — AB (ref >90)
Glucose, Bld: 140 mg/dL — ABNORMAL HIGH (ref 70–99)
POTASSIUM: 3.8 meq/L (ref 3.7–5.3)
Sodium: 138 mEq/L (ref 137–147)
Total Protein: 7 g/dL (ref 6.0–8.3)

## 2013-07-11 MED ORDER — HYDROXYZINE HCL 50 MG PO TABS
50.0000 mg | ORAL_TABLET | Freq: Every evening | ORAL | Status: DC | PRN
  Administered 2013-07-11: 50 mg via ORAL
  Filled 2013-07-11 (×4): qty 1
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
## 2013-07-11 NOTE — Progress Notes (Signed)
Patient ID: Darrell Espinoza, male   DOB: [birth year], 33 y.o.   MRN: [id number]  D: Pt was laying in bed during the initial assessment. Pt denied SI but stated "he tries to sleep because he gets depressed when awake".  Stated he was "drug free for one year". Pt stated he met with his daughter whom he hadn't seen in 4 yrs. Stated that night he decided some. Pt informed the writer that he needs a therapist. "I need somebody to get me on the right medicine. Stated that he goes to [facility] in [place].   A:  Support and encouragement was offered. 15 min checks continued for safety.  R: Pt remains safe.

## 2013-07-11 NOTE — [facility] Group Notes (Signed)
[facility] LCSW Group Therapy  07/11/2013 , 4:30 PM   Type of Therapy:  Group Therapy  Participation Level:  Invited.  Chose not to attend    Summary of Progress/Problems: Today's group focused on the term Diagnosis.  Participants were asked to define the term, and then pronounce whether it is a negative, positive or neutral term.  Darrell Espinoza, Darrell Espinoza 07/11/2013 , 4:30 PM

## 2013-07-11 NOTE — Progress Notes (Signed)
D: Pt denies SI/HI/AVH.Pt presents with flat affect and depressed mood. Pt reports depression 8/10 and 10/10. Per pt, he do not have any supportive people in his family. Pt stated that he have been dealing with depression for a long time and nothing has helped him in the past. Pt requesting to speak with a therapist once he is discharged. A: Medications administered as ordered per MD. Verbal support given. Pt encouraged to attend groups. 15 minute checks performed for safety. R: Pt safety maintained.

## 2013-07-11 NOTE — [facility] Group Notes (Signed)
[facility] Group Notes:  (Nursing/MHT/Case Management/Adjunct)  Date:  07/11/2013  Time:  10:26 AM  Type of Therapy:  wellness  Participation Level:  Minimal  Participation Quality:  Appropriate  Affect:  Appropriate  Cognitive:  Appropriate  Insight:  Appropriate  Engagement in Group:  Limited  Modes of Intervention:  Discussion and Education  Summary of Progress/Problems:"Hope for a caring family".   Erice Ahles L 07/11/2013, 10:26 AM

## 2013-07-11 NOTE — [facility] Group Notes (Signed)
Adult Psychoeducational Group Note  Date:  07/11/2013 Time:  9:46 PM  Group Topic/Focus:  Wrap-Up Group:   The focus of this group is to help patients review their daily goal of treatment and discuss progress on daily workbooks.  Participation Level:  Active  Participation Quality:  Appropriate  Affect:  Appropriate  Cognitive:  Appropriate  Insight: Appropriate  Engagement in Group:  Engaged  Modes of Intervention:  Discussion  Additional Comments:  Darrell Espinoza stated he had Espinoza good day and it was one of the best days he's had since being here.  He expressed that he is able to focus and played ball with the guys.  He stated that he is caring and likes to make people smile.  Darrell Espinoza, Darrell Espinoza 07/11/2013, 9:46 PM

## 2013-07-11 NOTE — Progress Notes (Signed)
Seen and agreed. Savhanna Sliva, MD 

## 2013-07-11 NOTE — Progress Notes (Signed)
Patient ID: Adela PortsJames Craver, male   DOB: 1980/10/02, 33 y.o.   MRN: [id number]  D: Pt spoke to the writer about his home meds. Stated he takes klonopin morning, noon and night. Informed the writer that the pharmacist informed him that remeron would be a med for him to consider due to having PTSD. Stated he realizes he needs help because "he can't work, can't get a job, and can't talk right in the interview."    A:  Support and encouragement was offered. 15 min checks continued for safety.  R: Pt remains safe.

## 2013-07-12 MED ORDER — HYDROXYZINE HCL 25 MG PO TABS
25.0000 mg | ORAL_TABLET | Freq: Three times a day (TID) | ORAL | Status: DC
  Administered 2013-07-12 – 2013-07-14 (×7): 25 mg via ORAL
  Filled 2013-07-12 (×4): qty 1
  Filled 2013-07-12 (×3): qty 42
  Filled 2013-07-12 (×6): qty 1

## 2013-07-12 MED ORDER — HYDROXYZINE HCL 50 MG PO TABS
50.0000 mg | ORAL_TABLET | Freq: Every evening | ORAL | Status: DC | PRN
  Administered 2013-07-12 – 2013-07-13 (×3): 50 mg via ORAL
  Filled 2013-07-12 (×3): qty 1

## 2013-07-12 NOTE — [facility] Suicide Risk Assessment (Signed)
[facility] INPATIENT:  Family/Significant Other Suicide Prevention Education  Suicide Prevention Education:  Education Completed; Fabian Novemberina Kells, mother, [phone] has been identified by the patient as the family member/significant other with whom the patient will be residing, and identified as the person(s) who will aid the patient in the event of a mental health crisis (suicidal ideations/suicide attempt).  With written consent from the patient, the family member/significant other has been provided the following suicide prevention education, prior to the and/or following the discharge of the patient.  The suicide prevention education provided includes the following:  Suicide risk factors  Suicide prevention and interventions  National Suicide Hotline telephone number  [facility] assessment telephone number  [facility] Assistance 911  [facility] and/or Residential Mobile Crisis Unit telephone number  Request made of family/significant other to:  Remove weapons (e.g., guns, rifles, knives), all items previously/currently identified as safety concern.    Remove drugs/medications (over-the-counter, prescriptions, illicit drugs), all items previously/currently identified as a safety concern.  The family member/significant other verbalizes understanding of the suicide prevention education information provided.  The family member/significant other agrees to remove the items of safety concern listed above.  Daryel Geraldorth, Emelina Hinch B 07/12/2013, 10:27 AM

## 2013-07-12 NOTE — [facility] Group Notes (Signed)
[facility]  07/12/2013 , 1:20 PM    Type of Therapy:  [facility] Presentation  Participation Level:  Active  Participation Quality:  Attentive  Affect:  Blunted  Cognitive:  Oriented  Insight:  Limited  Engagement in Therapy:  Engaged  Modes of Intervention:  Discussion, Education and Socialization  Summary of Progress/Problems:  Onalee HuaDavid from [facility] [employer] came to present his recovery story and play the guitar.  Sat quietly throughout the presentation.  Was mesmerized by the guitar playing as he has taken lessons himself, and could appreciate how difficult it is to play as well as Onalee Huaavid.  Thanked him multiple times for coming.  Daryel Geraldorth, Tijana Walder B 07/12/2013 , 1:20 PM
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
## 2013-07-12 NOTE — Progress Notes (Signed)
Patient ID: Darrell PortsJames Pigue, male   DOB: [birth year], 33 y.o.   MRN: [id number]  D: Pt was laying in bed during the assessment. Writer made mention to the fact that patient was already laying in bed sleeping. Pt stated, "it's been a long day". Stated, "I don't feel good". Writer encouraged pt to attend group.   A:  Support and encouragement was offered. 15 min checks continued for safety.  R: Pt remains safe.

## 2013-07-12 NOTE — Progress Notes (Signed)
D: Pt denies SI/HI/AVH. Pt reports feeling depressed this morning. Pt reports depression 7/10 and hopeless 10/10. Pt requesting to take klonopin for panic attacks and Remeron to help with PTSD. NP made aware of pt request. Pt engaged with other pts on the milieu, laughing, talking and playing the piano. Pt compliant with taking meds and attending groups. A: Medications administered as ordered per MD. Pt started on vistaril for anxiety. Verbal support given. Pt encouraged to attend groups. 15 minute checks performed for safety. R: Pt safety maintained.

## 2013-07-12 NOTE — [facility] Group Notes (Signed)
[facility] Note   07/12/2013 11:43 AM  Participation Quality:  Engaged  Mood/Affect:  Flat  Depression Rating:  5  Anxiety Rating:  5  Thoughts of Suicide:  No Will you contract for safety?   NA  Current AVH:  No  Plan for Discharge/Comments:  Reminds me that he wants to talk to the Dr about med changes and when he is able to go home.  Says mother will pick him up.  Reminds me that [place]pt [place].  Transportation Means: family  Supports: family  Kiribatiorth, Darrell Espinoza

## 2013-07-13 NOTE — [facility] Group Notes (Signed)
Adult Psychoeducational Group Note  Date:  07/13/2013 Time:  1000am  Group Topic/Focus:  Goals Group:   The focus of this group is to help patients establish daily goals to achieve during treatment and discuss how the patient can incorporate goal setting into their daily lives to aide in recovery. Orientation:   The focus of this group is to educate the patient on the purpose and policies of crisis stabilization and provide a format to answer questions about their admission.  The group details unit policies and expectations of patients while admitted.  Participation Level:  Active  Participation Quality:  Appropriate, Attentive, Sharing and Supportive  Affect:  Appropriate  Cognitive:  Alert and Appropriate  Insight: Improving  Engagement in Group:  Engaged  Modes of Intervention:  Discussion, Education, Orientation and Support  Additional Comments:  Pt set a goal for today to work on his anger management and identify positive coping skills for stress management, pt was attentive and positive during group and shared appropriately.  Darrell Espinoza, Darrell Espinoza Brooke 07/13/2013, 11:00 AM

## 2013-07-13 NOTE — [facility] Group Notes (Signed)
[facility] Group Notes:  (Counselor/Nursing/MHT/Case Management/Adjunct)  07/13/2013 1:15PM  Type of Therapy:  Group Therapy  Participation Level:  Active  Participation Quality:  Appropriate  Affect:  Flat  Cognitive:  Oriented  Insight:  Improving  Engagement in Group:  Limited  Engagement in Therapy:  Limited  Modes of Intervention:  Discussion, Exploration and Socialization  Summary of Progress/Problems: The topic for group was balance in life.  Pt participated in the discussion about when their life was in balance and out of balance and how this feels.  Pt discussed ways to get back in balance and short term goals they can work on to get where they want to be. Fayrene FearingJames stated he is balanced, and gave proof that he is here in group and participating.  He stated that when he came in, he was feeling overwhelmed and isolating in him room.  He identified that as a warning sign for him, which led to an interesting discussion of warning signs in general.  He attributed the turn around to several things; medication, a positive peer group, and telling himself he needed "act as if" he was actually doing better, and it worked.   Ida Rogueorth, Elleah Hemsley B 07/13/2013 3:36 PM
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
## 2013-07-13 NOTE — Progress Notes (Signed)
Adult Psychoeducational Group Note  Date:  07/13/2013 Time:  11:57 AM  Group Topic/Focus:  Therapeutic activity  Participation Level:  Active  Participation Quality:  Appropriate  Affect:  Appropriate  Cognitive:  Alert and Appropriate  Insight: Appropriate and Good  Engagement in Group:  Engaged  Modes of Intervention:  Support  Additional Comments:  Pt participated group.  Marquis Lunchbrahim, Glynn Yepes 07/13/2013, 11:57 AM

## 2013-07-13 NOTE — Progress Notes (Signed)
D:  Per pt self inventory pt reports sleeping fair, appetite good, energy level low, ability to pay attention poor, rates depression at an 8 out of 10 and hopelessness at a 10 out of 10, denies SI/HI at this time but states that he has been hearing voices that are saying negative things to him.  Pt states that he tries to ignore them but the last time he heard the voices was at breakfast, pt verbally contracts for safety.     A:  Emotional support provided, Encouraged pt to continue with treatment plan and attend all group activities, q15 min checks maintained for safety.  R:  Pt is receptive, going to groups and participating, brightens on approach, calm and cooperative with staff and other patients on the unit, pt's goal for today is to work on his anger management and identify positive coping skills for stress.

## 2013-07-14 MED ORDER — RISPERIDONE 2 MG PO TABS: 2.0000 mg | ORAL_TABLET | Freq: Two times a day (BID) | ORAL | Status: DC

## 2013-07-14 MED ORDER — BENZTROPINE MESYLATE 0.5 MG PO TABS: 0.5000 mg | ORAL_TABLET | Freq: Two times a day (BID) | ORAL | Status: DC

## 2013-07-14 MED ORDER — SERTRALINE HCL 100 MG PO TABS: 200.0000 mg | ORAL_TABLET | Freq: Every day | ORAL | Status: DC

## 2013-07-14 MED ORDER — GABAPENTIN 400 MG PO CAPS: 400.0000 mg | ORAL_CAPSULE | Freq: Three times a day (TID) | ORAL | Status: DC

## 2013-07-14 MED ORDER — HYDROXYZINE HCL 25 MG PO TABS: 25.0000 mg | ORAL_TABLET | Freq: Three times a day (TID) | ORAL | Status: DC

## 2013-07-14 NOTE — Progress Notes (Signed)
Patient ID: Darrell Espinoza, male   DOB: 1981/04/22, 33 y.o.   MRN: [id number] D. Patient presents with depressed mood, affect congruent. Patient states '' I'm supposed to be going home today . '' Patient reports he feels ready for discharge and mood has improved stating '' I feel a little better, I guess the best I've felt in a while. '' Patient denies any SI/HI or A/V Hallucinations. Patient concerned regarding lab work (elevated LFT ) discussed healthy lifestyle and alcohol consumption with patient. Patient has been visible on the unit, interactive with peers. A. Support and encouragement provided. Medications given as ordered. R. Patient denies any further voiced concerns at this time. Will continue to monitor q 15 minutes for safety.

## 2013-07-14 NOTE — Progress Notes (Signed)
[facility] :  Will you be returning to the same living situation after discharge: Yes,  home At discharge, do you have transportation home?:Yes,  family Do you have the ability to pay for your medications:Yes,  mental health  Release of information consent forms completed and in the chart;  Patient's signature needed at discharge.  Patient to Follow up at: Follow-up Information   Follow up with [facility] On 07/20/2013. (Thursday @ 10:00 with Mr Newt Lukesufts)    Contact information:   [address]  [place]  [phone]      Patient denies SI/HI:   Yes,  yes    Safety Planning and Suicide Prevention discussed:  Yes,  yes  Ida Rogueorth, Mackensi Mahadeo B 07/14/2013, 8:27 AM
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
## 2013-07-14 NOTE — Tx Team (Signed)
  Interdisciplinary Treatment Plan Update   Date Reviewed:  07/14/2013  Time Reviewed:  8:26 AM  Progress in Treatment:   Attending groups: Yes Participating in groups: Yes Taking medication as prescribed: Yes  Tolerating medication: Yes Family/Significant other contact made: Yes  Patient understands diagnosis: Yes  Discussing patient identified problems/goals with staff: Yes Medical problems stabilized or resolved: Yes Denies suicidal/homicidal ideation: Yes Patient has not harmed self or others: Yes  For review of initial/current patient goals, please see plan of care.  Estimated Length of Stay:  D/C today  Reason for Continuation of Hospitalization:   New Problems/Goals identified:  N/A  Discharge Plan or Barriers:   return home, follow up outpt  Additional Comments:  Attendees:  Signature: Thedore MinsMojeed Akintayo, MD 07/14/2013 8:26 AM   Signature: Richelle Itood Josiane Labine, LCSW 07/14/2013 8:26 AM  Signature: Fransisca KaufmannLaura Davis, NP 07/14/2013 8:26 AM  Signature: Joslyn Devonaroline Beaudry, RN 07/14/2013 8:26 AM  Signature: Liborio NixonPatrice White, RN 07/14/2013 8:26 AM  Signature:  07/14/2013 8:26 AM  Signature:   07/14/2013 8:26 AM  Signature:    Signature:    Signature:    Signature:    Signature:    Signature:      Scribe for Treatment Team:   Richelle Itood Evony Rezek, LCSW  07/14/2013 8:26 AM

## 2013-07-14 NOTE — Progress Notes (Signed)
Patient ID: Darrell Espinoza, male   DOB: Jul 20, 1980, 33 y.o.   MRN: [id number] D. Orders received for patients discharge. Patient aware of pending discharge stating '' I've phoned my mother and she'll be here soon. '' Discharge instructions reviewed at length, copy of AVS provided. Crisis services reviewed at length. Rx given as well as information handouts on medications, and free 14 day medication supply. All belongings returned. Patient verbalized understanding of follow up care, opportunity for questions provided. No further voiced concerns. Pt denies any SI/HI A/V Hallucinations. No signs of acute decompensation. Pt escorted from unit with writer to the lobby to care of mother.

## 2013-07-14 NOTE — Progress Notes (Signed)
Adult Psychoeducational Group Note  Date:  07/14/2013 Time:  3:58 AM  Group Topic/Focus:  Wrap-Up Group:   The focus of this group is to help patients review their daily goal of treatment and discuss progress on daily workbooks.  Participation Level:  Active  Participation Quality:  Appropriate  Affect:  Appropriate  Cognitive:  Alert  Insight: Appropriate  Engagement in Group:  Engaged  Modes of Intervention:  Discussion  Additional Comments:    Flonnie HailstoneCOOKE, Azaylah Stailey R 07/14/2013, 3:58 AM
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
## 2013-07-15 NOTE — Discharge Summary (Signed)
Seen and agreed. Barney Russomanno, MD 

## 2013-07-15 NOTE — Progress Notes (Signed)
Seen and agreed. Hall Birchard, MD 

## 2013-07-19 NOTE — Progress Notes (Signed)
Patient Discharge Instructions:  After Visit Summary (AVS):   Faxed to:  07/19/13 Discharge Summary Note:   Faxed to:  07/19/13 Psychiatric Admission Assessment Note:   Faxed to:  07/19/13 Suicide Risk Assessment - Discharge Assessment:   Faxed to:  07/19/13 Faxed/Sent to the Next Level Care provider:  07/19/13 Faxed to [facility] @ [phone]  Jerelene ReddenSheena E [place], 07/19/2013, 2:26 PM

## 2014-06-06 IMAGING — CR DG CERVICAL SPINE 2 OR 3 VIEWS
3 series · 3 of 3 positions shown · non-contrast
Comparison: None.

CLINICAL DATA: Right neck pain.

EXAM:
CERVICAL SPINE - 2-3 VIEW

[view not recorded (1 of 3)]
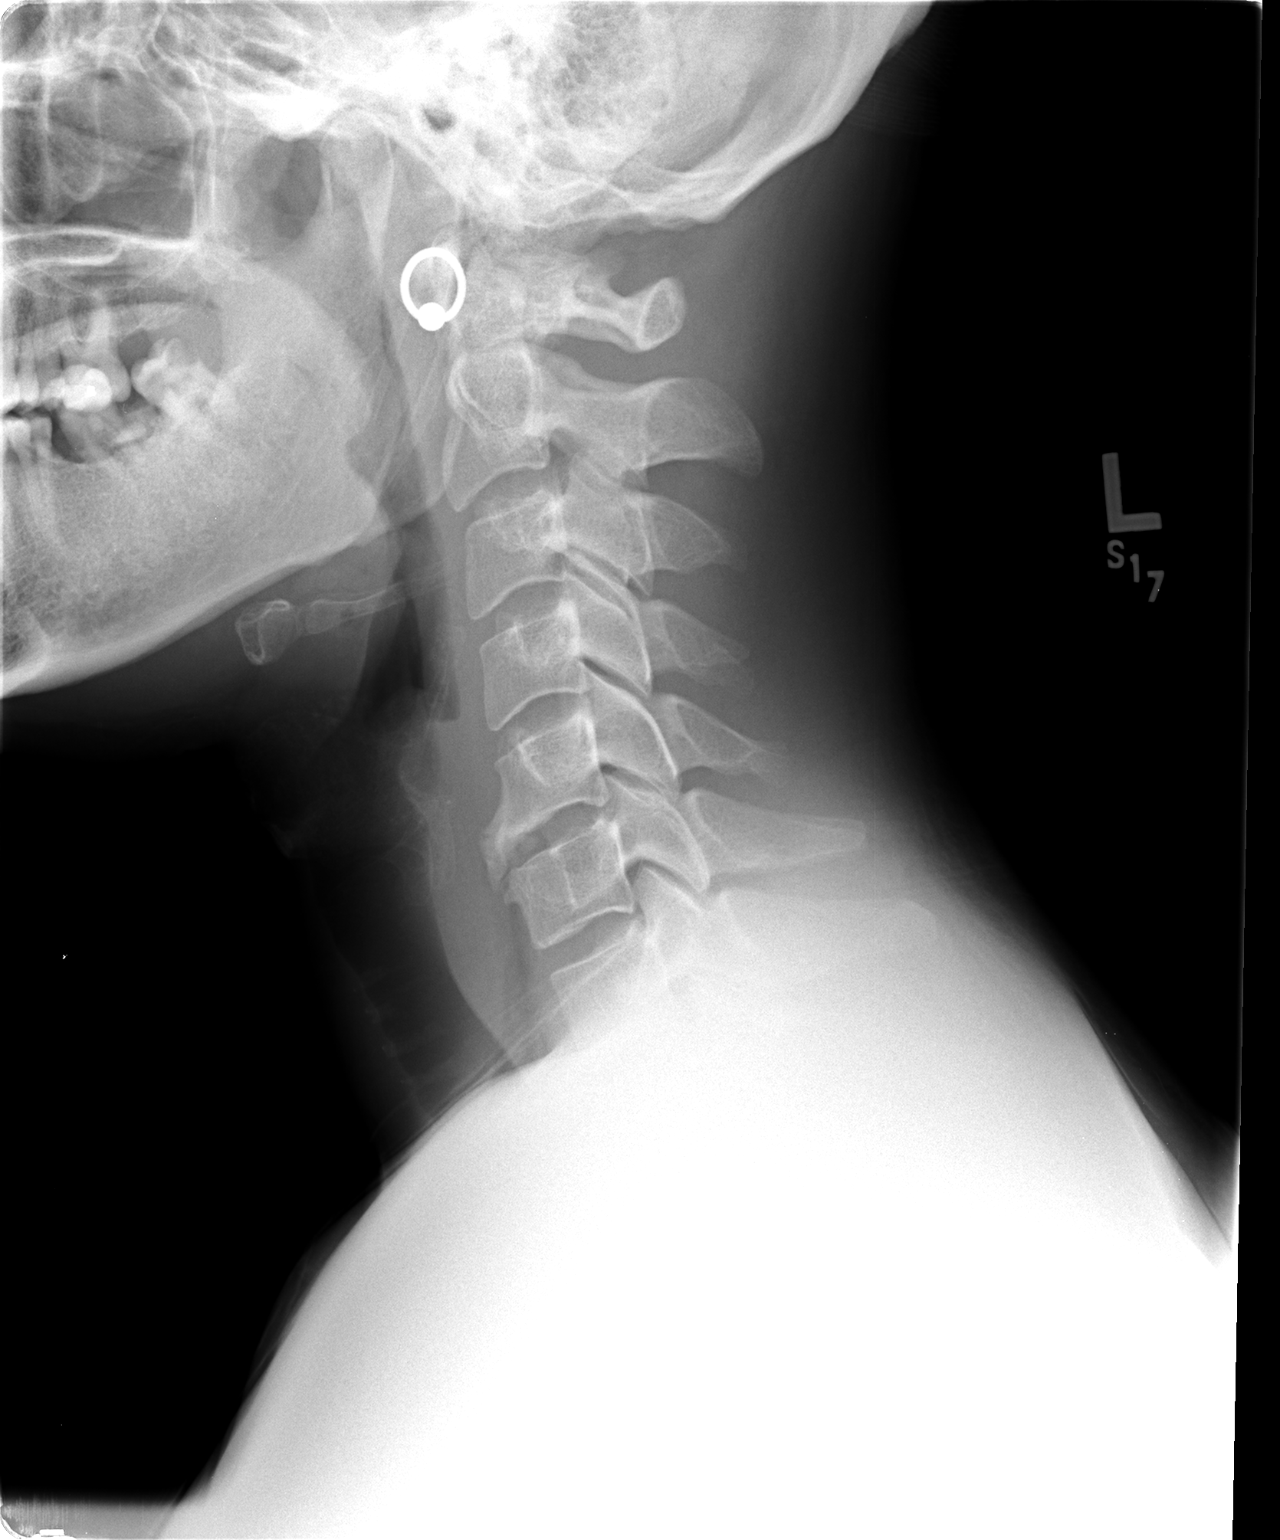

[view not recorded (2 of 3)]
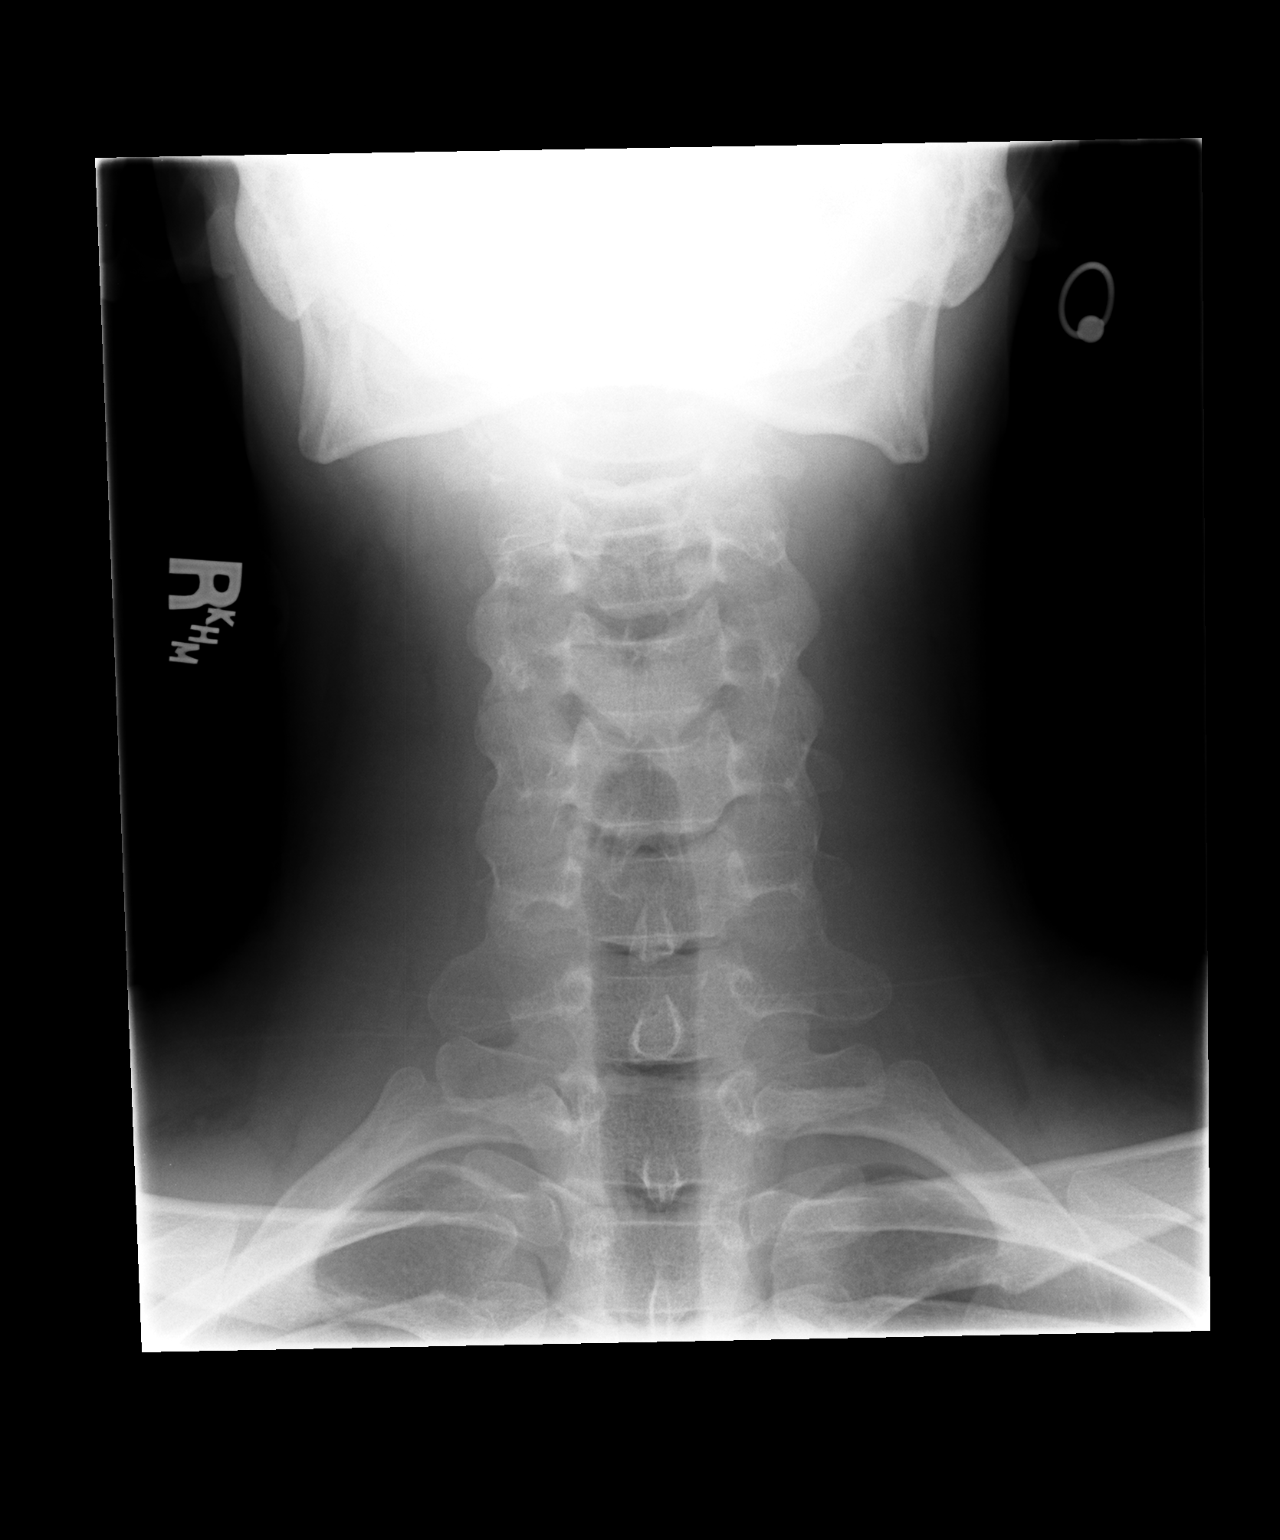

[view not recorded (3 of 3)]
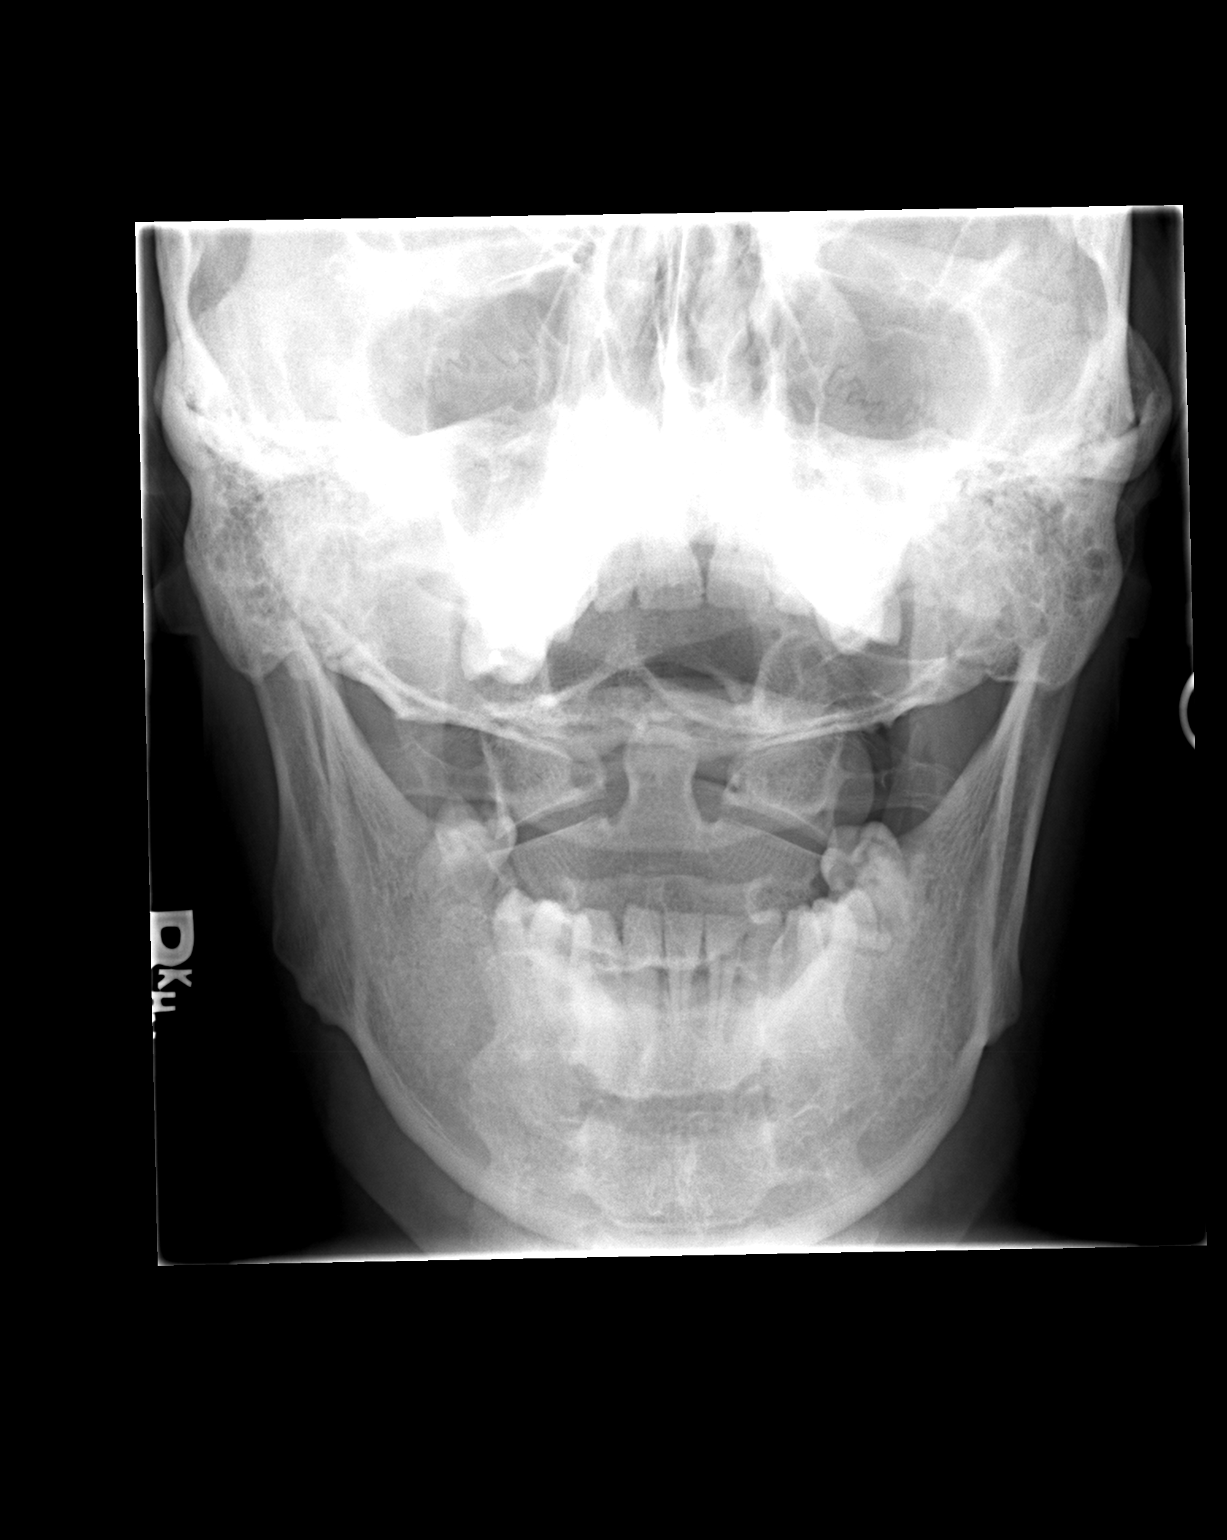

[3 of 3 positions shown; findings below may reference images not displayed]

FINDINGS: The cervical spine is visualized to the level of C7.

The vertebral body heights are maintained. The alignment is normal.
The prevertebral soft tissues are normal. There is no acute fracture
or static listhesis. There is degenerative disc disease at C5-6 with
an anterior bridging osteophyte.
IMPRESSION: Mild degenerative disc disease at C5-6 with an anterior bridging
osteophyte.

## 2014-06-07 ENCOUNTER — Encounter (HOSPITAL_COMMUNITY): Payer: Self-pay | Admitting: Emergency Medicine

## 2014-06-07 ENCOUNTER — Emergency Department (HOSPITAL_COMMUNITY)
Admission: EM | Admit: 2014-06-07 | Discharge: 2014-06-08 | Disposition: A | Discharge status: Other Acute Inpatient Hospital | Payer: Self-pay | Attending: Emergency Medicine | Admitting: Emergency Medicine

## 2014-06-07 DIAGNOSIS — F332 Major depressive disorder, recurrent severe without psychotic features: Secondary | ICD-10-CM | POA: Diagnosis present

## 2014-06-07 DIAGNOSIS — F411 Generalized anxiety disorder: Secondary | ICD-10-CM | POA: Diagnosis present

## 2014-06-07 DIAGNOSIS — F4310 Post-traumatic stress disorder, unspecified: Secondary | ICD-10-CM | POA: Diagnosis present

## 2014-06-07 DIAGNOSIS — Z590 Homelessness: Secondary | ICD-10-CM | POA: Insufficient documentation

## 2014-06-07 DIAGNOSIS — R45851 Suicidal ideations: Secondary | ICD-10-CM

## 2014-06-07 DIAGNOSIS — F329 Major depressive disorder, single episode, unspecified: Secondary | ICD-10-CM | POA: Insufficient documentation

## 2014-06-07 LAB — COMPREHENSIVE METABOLIC PANEL
ALT: 18 U/L (ref 0–53)
ANION GAP: 13 (ref 5–15)
AST: 18 U/L (ref 0–37)
Albumin: 4.3 g/dL (ref 3.5–5.2)
Alkaline Phosphatase: 88 U/L (ref 39–117)
BUN: 12 mg/dL (ref 6–23)
CALCIUM: 9.2 mg/dL (ref 8.4–10.5)
CO2: 25 mEq/L (ref 19–32)
CREATININE: 1.05 mg/dL (ref 0.50–1.35)
Chloride: 100 mEq/L (ref 96–112)
GFR calc non Af Amer: >90 mL/min (ref >90)
GLUCOSE: 92 mg/dL (ref 70–99)
Potassium: 3.6 mEq/L — ABNORMAL LOW (ref 3.7–5.3)
Sodium: 138 mEq/L (ref 137–147)
Total Bilirubin: 0.4 mg/dL (ref 0.3–1.2)
Total Protein: 7.4 g/dL (ref 6.0–8.3)

## 2014-06-07 LAB — CBC WITH DIFFERENTIAL/PLATELET
Basophils Absolute: 0.1 10*3/uL (ref 0.0–0.1)
Basophils Relative: 0 % (ref 0–1)
EOS ABS: 0.2 10*3/uL (ref 0.0–0.7)
EOS PCT: 2 % (ref 0–5)
HCT: 46.2 % (ref 39.0–52.0)
HEMOGLOBIN: 16.3 g/dL (ref 13.0–17.0)
LYMPHS ABS: 3.8 10*3/uL (ref 0.7–4.0)
Lymphocytes Relative: 28 % (ref 12–46)
MCH: 32.3 pg (ref 26.0–34.0)
MCHC: 35.3 g/dL (ref 30.0–36.0)
MCV: 91.5 fL (ref 78.0–100.0)
MONOS PCT: 7 % (ref 3–12)
Monocytes Absolute: 1 10*3/uL (ref 0.1–1.0)
Neutro Abs: 8.4 10*3/uL — ABNORMAL HIGH (ref 1.7–7.7)
Neutrophils Relative %: 63 % (ref 43–77)
Platelets: 280 10*3/uL (ref 150–400)
RBC: 5.05 MIL/uL (ref 4.22–5.81)
RDW: 13.4 % (ref 11.5–15.5)
WBC: 13.4 10*3/uL — ABNORMAL HIGH (ref 4.0–10.5)

## 2014-06-07 LAB — SALICYLATE LEVEL

## 2014-06-07 LAB — RAPID URINE DRUG SCREEN, HOSP PERFORMED
Amphetamines: NOT DETECTED
BARBITURATES: NOT DETECTED
Benzodiazepines: NOT DETECTED
Cocaine: NOT DETECTED
Opiates: NOT DETECTED
TETRAHYDROCANNABINOL: NOT DETECTED

## 2014-06-07 LAB — URINALYSIS, ROUTINE W REFLEX MICROSCOPIC
BILIRUBIN URINE: NEGATIVE
GLUCOSE, UA: NEGATIVE mg/dL
HGB URINE DIPSTICK: NEGATIVE
KETONES UR: NEGATIVE mg/dL
Leukocytes, UA: NEGATIVE
Nitrite: NEGATIVE
PROTEIN: NEGATIVE mg/dL
Specific Gravity, Urine: 1.015 (ref 1.005–1.030)
UROBILINOGEN UA: 0.2 mg/dL (ref 0.0–1.0)
pH: 5.5 (ref 5.0–8.0)

## 2014-06-07 LAB — ETHANOL: Alcohol, Ethyl (B): <11 mg/dL (ref 0–11)

## 2014-06-07 LAB — ACETAMINOPHEN LEVEL

## 2014-06-07 MED ORDER — LORAZEPAM 1 MG PO TABS: 2.0000 mg | ORAL_TABLET | Freq: Once | ORAL | Status: DC

## 2014-06-07 MED ORDER — LORAZEPAM 1 MG PO TABS
2.0000 mg | ORAL_TABLET | Freq: Once | ORAL | Status: AC
  Administered 2014-06-07: 2 mg via ORAL

## 2014-06-07 MED ORDER — LORAZEPAM 1 MG PO TABS
ORAL_TABLET | ORAL | Status: AC
  Filled 2014-06-07: qty 2

## 2014-06-07 NOTE — ED Notes (Signed)
Pt left via [facility], all belongings with transport service. Pt awake, A&O x4. No concerns voiced.

## 2014-06-07 NOTE — ED Notes (Signed)
EDP spoke with patient, he agreed to have labs drawn. EDP also noted [facility]  After Labs drawn, will call ED charge and [facility] and arrange transport for patient to [facility].

## 2014-06-07 NOTE — BH Assessment (Signed)
Requested cart be placed for assessment.   Spoke with Dr. Fayrene FearingJames who reports pt presents with depression, hx of PTSD and possible past dx of Bipolar. Pt has been off of his medications since May due to costs. Reports multiple inpt hospitalizations, with similar episode last January. Pt has 34 y.o dtr who he has not seen, and her [place] OuchBirthday is coming up. This stressor precipitated episode last year, and may have increased current sx. Pt had recent break up with girl friend and is currently homeless. Pt reports he wants to die. No past attempts noted.   Assessment to commence shortly.   Clista BernhardtNancy Kaitlyn Skowron, [facility] Triage Specialist 06/07/2014 9:18 PM
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
## 2014-06-07 NOTE — ED Notes (Signed)
telepsyc in process.
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
## 2014-06-07 NOTE — ED Notes (Signed)
Pt nervous and trembling. Given PO ativan per EDP order.  Pt pacing floor, but not agitated, just tearful and upset.
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
## 2014-06-08 ENCOUNTER — Encounter (HOSPITAL_COMMUNITY): Payer: Self-pay | Admitting: Psychiatry

## 2014-06-08 DIAGNOSIS — F411 Generalized anxiety disorder: Secondary | ICD-10-CM | POA: Diagnosis present

## 2014-06-08 DIAGNOSIS — R45851 Suicidal ideations: Secondary | ICD-10-CM

## 2014-06-08 DIAGNOSIS — F332 Major depressive disorder, recurrent severe without psychotic features: Secondary | ICD-10-CM

## 2014-06-08 DIAGNOSIS — F4310 Post-traumatic stress disorder, unspecified: Secondary | ICD-10-CM

## 2014-06-08 MED ORDER — ZIPRASIDONE MESYLATE 20 MG IM SOLR
20.0000 mg | Freq: Once | INTRAMUSCULAR | Status: AC
  Administered 2014-06-08: 20 mg via INTRAMUSCULAR

## 2014-06-08 MED ORDER — LORAZEPAM 2 MG/ML IJ SOLN
2.0000 mg | Freq: Once | INTRAMUSCULAR | Status: AC
  Administered 2014-06-08: 2 mg via INTRAMUSCULAR

## 2014-06-08 MED ORDER — SERTRALINE HCL 50 MG PO TABS: 50.0000 mg | ORAL_TABLET | Freq: Every day | ORAL | Status: DC

## 2014-06-08 MED ORDER — TRAZODONE HCL 100 MG PO TABS: 100.0000 mg | ORAL_TABLET | Freq: Every evening | ORAL | Status: DC | PRN

## 2014-06-08 MED ORDER — NICOTINE 21 MG/24HR TD PT24
21.0000 mg | MEDICATED_PATCH | Freq: Every day | TRANSDERMAL | Status: DC
  Administered 2014-06-08: 21 mg via TRANSDERMAL

## 2014-06-08 MED ORDER — OLANZAPINE 10 MG PO TABS: 10.0000 mg | ORAL_TABLET | Freq: Every day | ORAL | Status: DC

## 2014-06-08 MED ORDER — DIPHENHYDRAMINE HCL 50 MG/ML IJ SOLN
50.0000 mg | Freq: Once | INTRAMUSCULAR | Status: AC
  Administered 2014-06-08: 50 mg via INTRAMUSCULAR

## 2014-06-08 NOTE — ED Notes (Addendum)
[facility], presents SI with plan to hang or stab self.  Denies HI or AV hallucinations, feeling hopeless, attempted SI 2 years ago, reports having racing thoughts.  Pt admits to PTSD, Bipolar DO and chronic back pain.  Pt states he has been off meds since May 2015.  Pt cooperative, slightly anxious at present.
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
## 2014-06-08 NOTE — ED Notes (Signed)
Pt was agitated banging head,stated he was hearing loud voices on the other side of the wall . Pt was given Geodon,benadryl and ativan IM.Coopeartive during the procedure.

## 2014-06-08 NOTE — BH Assessment (Signed)
Inpt recommended, [facility] Sent referral to the following facilities for potential placement: [facility]  Nare [place], [place] Triage Specialist 06/08/2014 2:19 AM

## 2014-06-08 NOTE — ED Notes (Signed)
[facility] Expected date:  Expected time:  Means of arrival:  Comments: Hold for pt. [facility]

## 2014-09-20 ENCOUNTER — Encounter (HOSPITAL_COMMUNITY): Payer: Self-pay | Admitting: *Deleted

## 2014-09-20 ENCOUNTER — Emergency Department (HOSPITAL_COMMUNITY)
Admission: EM | Admit: 2014-09-20 | Discharge: 2014-09-20 | Disposition: A | Discharge status: Home / Self Care | Payer: Self-pay | Attending: Emergency Medicine | Admitting: Emergency Medicine

## 2014-09-20 DIAGNOSIS — K029 Dental caries, unspecified: Secondary | ICD-10-CM | POA: Insufficient documentation

## 2014-09-20 DIAGNOSIS — F4310 Post-traumatic stress disorder, unspecified: Secondary | ICD-10-CM | POA: Insufficient documentation

## 2014-09-20 DIAGNOSIS — F419 Anxiety disorder, unspecified: Secondary | ICD-10-CM | POA: Insufficient documentation

## 2014-09-20 DIAGNOSIS — Z880 Allergy status to penicillin: Secondary | ICD-10-CM | POA: Insufficient documentation

## 2014-09-20 DIAGNOSIS — Z792 Long term (current) use of antibiotics: Secondary | ICD-10-CM | POA: Insufficient documentation

## 2014-09-20 DIAGNOSIS — Z79899 Other long term (current) drug therapy: Secondary | ICD-10-CM | POA: Insufficient documentation

## 2014-09-20 DIAGNOSIS — F319 Bipolar disorder, unspecified: Secondary | ICD-10-CM | POA: Insufficient documentation

## 2014-09-20 DIAGNOSIS — K088 Other specified disorders of teeth and supporting structures: Secondary | ICD-10-CM | POA: Insufficient documentation

## 2014-09-20 DIAGNOSIS — Z720 Tobacco use: Secondary | ICD-10-CM | POA: Insufficient documentation

## 2014-09-20 DIAGNOSIS — G8929 Other chronic pain: Secondary | ICD-10-CM | POA: Insufficient documentation

## 2014-09-20 MED ORDER — NAPROXEN 500 MG PO TABS: 500.0000 mg | ORAL_TABLET | Freq: Two times a day (BID) | ORAL | Status: DC

## 2014-09-20 MED ORDER — DOXYCYCLINE HYCLATE 100 MG PO TABS
100.0000 mg | ORAL_TABLET | Freq: Once | ORAL | Status: AC
  Administered 2014-09-20: 100 mg via ORAL
  Filled 2014-09-20: qty 1

## 2014-09-20 MED ORDER — DOXYCYCLINE HYCLATE 100 MG PO CAPS: 100.0000 mg | ORAL_CAPSULE | Freq: Two times a day (BID) | ORAL | Status: DC

## 2014-09-20 MED ORDER — OXYCODONE-ACETAMINOPHEN 5-325 MG PO TABS
1.0000 | ORAL_TABLET | Freq: Once | ORAL | Status: AC
  Administered 2014-09-20: 1 via ORAL
  Filled 2014-09-20: qty 1

## 2014-09-20 NOTE — ED Notes (Signed)
Dental pain since Monday.

## 2014-09-20 NOTE — ED Notes (Signed)
Dental pain, has mult caries,
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
## 2014-09-20 NOTE — Discharge Instructions (Signed)
Dental Pain A tooth ache may be caused by cavities (tooth decay). Cavities expose the nerve of the tooth to air and hot or cold temperatures. It may come from an infection or abscess (also called a boil or furuncle) around your tooth. It is also often caused by dental caries (tooth decay). This causes the pain you are having. DIAGNOSIS  Your caregiver can diagnose this problem by exam. TREATMENT   If caused by an infection, it may be treated with medications which kill germs (antibiotics) and pain medications as prescribed by your caregiver. Take medications as directed.  Only take over-the-counter or prescription medicines for pain, discomfort, or fever as directed by your caregiver.  Whether the tooth ache today is caused by infection or dental disease, you should see your dentist as soon as possible for further care. SEEK MEDICAL CARE IF: The exam and treatment you received today has been provided on an emergency basis only. This is not a substitute for complete medical or dental care. If your problem worsens or new problems (symptoms) appear, and you are unable to meet with your dentist, call or return to this location. SEEK IMMEDIATE MEDICAL CARE IF:   You have a fever.  You develop redness and swelling of your face, jaw, or neck.  You are unable to open your mouth.  You have severe pain uncontrolled by pain medicine. MAKE SURE YOU:   Understand these instructions.  Will watch your condition.  Will get help right away if you are not doing well or get worse. Document Released: 06/22/2005 Document Revised: 09/14/2011 Document Reviewed: 02/08/2008 [facility] Patient Information 2015 [place], [place]. This information is not intended to replace advice given to you by your health care provider. Make sure you discuss any questions you have with your health care provider.  Dental Caries Dental caries is tooth decay. This decay can cause a hole in teeth (cavity) that can get bigger and  deeper over time. HOME CARE  Brush and floss your teeth. Do this at least two times a day.  Use a fluoride toothpaste.  Use a mouth rinse if told by your dentist or doctor.  Eat less sugary and starchy foods. Drink less sugary drinks.  Avoid snacking often on sugary and starchy foods. Avoid sipping often on sugary drinks.  Keep regular checkups and cleanings with your dentist.  Use fluoride supplements if told by your dentist or doctor.  Allow fluoride to be applied to teeth if told by your dentist or doctor. Document Released: 03/31/2008 Document Revised: 11/06/2013 Document Reviewed: 06/24/2012 [facility] Patient Information 2015 [place], [place]. This information is not intended to replace advice given to you by your health care provider. Make sure you discuss any questions you have with your health care provider.  Dental Care and Dentist Visits Dental care supports good overall health. Regular dental visits can also help you avoid dental pain, bleeding, infection, and other more serious health problems in the future. It is important to keep the mouth healthy because diseases in the teeth, gums, and other oral tissues can spread to other areas of the body. Some problems, such as diabetes, heart disease, and pre-term labor have been associated with poor oral health.  See your dentist every 6 months. If you experience emergency problems such as a toothache or broken tooth, go to the dentist right away. If you see your dentist regularly, you may catch problems early. It is easier to be treated for problems in the early stages.  WHAT TO EXPECT  AT A DENTIST VISIT  °Your dentist will look for many common oral health problems and recommend proper treatment. At your regular dental visit, you can expect: °· Gentle cleaning of the teeth and gums. This includes scraping and polishing. This helps to remove the sticky substance around the teeth and gums (plaque). Plaque forms in the mouth shortly after  eating. Over time, plaque hardens on the teeth as tartar. If tartar is not removed regularly, it can cause problems. Cleaning also helps remove stains. °· Periodic X-rays. These pictures of the teeth and supporting bone will help your dentist assess the health of your teeth. °· Periodic fluoride treatments. Fluoride is a natural mineral shown to help strengthen teeth. Fluoride treatment involves applying a fluoride gel or varnish to the teeth. It is most commonly done in children. °· Examination of the mouth, tongue, jaws, teeth, and gums to look for any oral health problems, such as: °¨ Cavities (dental caries). This is decay on the tooth caused by plaque, sugar, and acid in the mouth. It is best to catch a cavity when it is small. °¨ Inflammation of the gums caused by plaque buildup (gingivitis). °¨ Problems with the mouth or malformed or misaligned teeth. °¨ Oral cancer or other diseases of the soft tissues or jaws.  °KEEP YOUR TEETH AND GUMS HEALTHY °For healthy teeth and gums, follow these general guidelines as well as your dentist's specific advice: °· Have your teeth professionally cleaned at the dentist every 6 months. °· Brush twice daily with a fluoride toothpaste. °· Floss your teeth daily.  °· Ask your dentist if you need fluoride supplements, treatments, or fluoride toothpaste. °· Eat a healthy diet. Reduce foods and drinks with added sugar. °· Avoid smoking. °TREATMENT FOR ORAL HEALTH PROBLEMS °If you have oral health problems, treatment varies depending on the conditions present in your teeth and gums. °· Your caregiver will most likely recommend good oral hygiene at each visit. °· For cavities, gingivitis, or other oral health disease, your caregiver will perform a procedure to treat the problem. This is typically done at a separate appointment. Sometimes your caregiver will refer you to another dental specialist for specific tooth problems or for surgery. °SEEK IMMEDIATE DENTAL CARE IF: °· You have  pain, bleeding, or soreness in the gum, tooth, jaw, or mouth area. °· A permanent tooth becomes loose or separated from the gum socket. °· You experience a blow or injury to the mouth or jaw area. °Document Released: 03/04/2011 Document Revised: 09/14/2011 Document Reviewed: 03/04/2011 °[employer]® Patient Information ©2015 [employer]. This information is not intended to replace advice given to you by your health care provider. Make sure you discuss any questions you have with your health care provider. ° °

## 2014-12-06 ENCOUNTER — Encounter (HOSPITAL_COMMUNITY): Payer: Self-pay | Admitting: Emergency Medicine

## 2014-12-06 ENCOUNTER — Emergency Department (HOSPITAL_COMMUNITY)
Admission: EM | Admit: 2014-12-06 | Discharge: 2014-12-06 | Disposition: A | Discharge status: Other Acute Inpatient Hospital | Payer: Self-pay | Attending: Emergency Medicine | Admitting: Emergency Medicine

## 2014-12-06 ENCOUNTER — Inpatient Hospital Stay (HOSPITAL_COMMUNITY)
Admission: AD | Admit: 2014-12-06 | Discharge: 2014-12-11 | DRG: 885 | Disposition: A | Discharge status: Home / Self Care | Payer: 59 | Source: Intra-hospital | Attending: Psychiatry | Admitting: Psychiatry

## 2014-12-06 ENCOUNTER — Encounter (HOSPITAL_COMMUNITY): Payer: Self-pay | Admitting: Behavioral Health

## 2014-12-06 DIAGNOSIS — F1310 Sedative, hypnotic or anxiolytic abuse, uncomplicated: Secondary | ICD-10-CM | POA: Diagnosis present

## 2014-12-06 DIAGNOSIS — F329 Major depressive disorder, single episode, unspecified: Secondary | ICD-10-CM | POA: Insufficient documentation

## 2014-12-06 DIAGNOSIS — F332 Major depressive disorder, recurrent severe without psychotic features: Principal | ICD-10-CM | POA: Diagnosis present

## 2014-12-06 DIAGNOSIS — F411 Generalized anxiety disorder: Secondary | ICD-10-CM | POA: Diagnosis present

## 2014-12-06 DIAGNOSIS — Z880 Allergy status to penicillin: Secondary | ICD-10-CM | POA: Insufficient documentation

## 2014-12-06 DIAGNOSIS — K088 Other specified disorders of teeth and supporting structures: Secondary | ICD-10-CM | POA: Diagnosis present

## 2014-12-06 DIAGNOSIS — F319 Bipolar disorder, unspecified: Secondary | ICD-10-CM | POA: Insufficient documentation

## 2014-12-06 DIAGNOSIS — Z62810 Personal history of physical and sexual abuse in childhood: Secondary | ICD-10-CM | POA: Diagnosis present

## 2014-12-06 DIAGNOSIS — R000 Tachycardia, unspecified: Secondary | ICD-10-CM | POA: Insufficient documentation

## 2014-12-06 DIAGNOSIS — F32A Depression, unspecified: Secondary | ICD-10-CM | POA: Insufficient documentation

## 2014-12-06 DIAGNOSIS — R45851 Suicidal ideations: Secondary | ICD-10-CM | POA: Diagnosis present

## 2014-12-06 DIAGNOSIS — F17210 Nicotine dependence, cigarettes, uncomplicated: Secondary | ICD-10-CM | POA: Diagnosis present

## 2014-12-06 DIAGNOSIS — F4310 Post-traumatic stress disorder, unspecified: Secondary | ICD-10-CM | POA: Diagnosis present

## 2014-12-06 DIAGNOSIS — G8929 Other chronic pain: Secondary | ICD-10-CM | POA: Insufficient documentation

## 2014-12-06 DIAGNOSIS — F419 Anxiety disorder, unspecified: Secondary | ICD-10-CM | POA: Insufficient documentation

## 2014-12-06 DIAGNOSIS — Z792 Long term (current) use of antibiotics: Secondary | ICD-10-CM | POA: Insufficient documentation

## 2014-12-06 DIAGNOSIS — Z590 Homelessness: Secondary | ICD-10-CM

## 2014-12-06 DIAGNOSIS — F410 Panic disorder [episodic paroxysmal anxiety] without agoraphobia: Secondary | ICD-10-CM | POA: Diagnosis present

## 2014-12-06 DIAGNOSIS — G4700 Insomnia, unspecified: Secondary | ICD-10-CM | POA: Diagnosis present

## 2014-12-06 DIAGNOSIS — Z720 Tobacco use: Secondary | ICD-10-CM | POA: Insufficient documentation

## 2014-12-06 DIAGNOSIS — Z79899 Other long term (current) drug therapy: Secondary | ICD-10-CM | POA: Insufficient documentation

## 2014-12-06 DIAGNOSIS — I10 Essential (primary) hypertension: Secondary | ICD-10-CM | POA: Diagnosis present

## 2014-12-06 DIAGNOSIS — Z87828 Personal history of other (healed) physical injury and trauma: Secondary | ICD-10-CM | POA: Insufficient documentation

## 2014-12-06 DIAGNOSIS — Z791 Long term (current) use of non-steroidal anti-inflammatories (NSAID): Secondary | ICD-10-CM | POA: Insufficient documentation

## 2014-12-06 DIAGNOSIS — F1910 Other psychoactive substance abuse, uncomplicated: Secondary | ICD-10-CM | POA: Diagnosis present

## 2014-12-06 HISTORY — DX: Suicidal ideations: R45.851

## 2014-12-06 LAB — CBC WITH DIFFERENTIAL/PLATELET
Basophils Absolute: 0 10*3/uL (ref 0.0–0.1)
Basophils Relative: 0 % (ref 0–1)
Eosinophils Absolute: 0.1 10*3/uL (ref 0.0–0.7)
Eosinophils Relative: 1 % (ref 0–5)
HCT: 51.3 % (ref 39.0–52.0)
Hemoglobin: 18.2 g/dL — ABNORMAL HIGH (ref 13.0–17.0)
Lymphocytes Relative: 22 % (ref 12–46)
Lymphs Abs: 2.3 10*3/uL (ref 0.7–4.0)
MCH: 32.1 pg (ref 26.0–34.0)
MCHC: 35.5 g/dL (ref 30.0–36.0)
MCV: 90.5 fL (ref 78.0–100.0)
Monocytes Absolute: 0.6 10*3/uL (ref 0.1–1.0)
Monocytes Relative: 6 % (ref 3–12)
Neutro Abs: 7.1 10*3/uL (ref 1.7–7.7)
Neutrophils Relative %: 71 % (ref 43–77)
Platelets: 294 10*3/uL (ref 150–400)
RBC: 5.67 MIL/uL (ref 4.22–5.81)
RDW: 13.4 % (ref 11.5–15.5)
WBC: 10.1 10*3/uL (ref 4.0–10.5)

## 2014-12-06 LAB — RAPID URINE DRUG SCREEN, HOSP PERFORMED
Amphetamines: NOT DETECTED
Barbiturates: NOT DETECTED
Benzodiazepines: POSITIVE — AB
Cocaine: POSITIVE — AB
Opiates: NOT DETECTED
Tetrahydrocannabinol: POSITIVE — AB

## 2014-12-06 LAB — BASIC METABOLIC PANEL
Anion gap: 8 (ref 5–15)
BUN: 12 mg/dL (ref 6–20)
CO2: 27 mmol/L (ref 22–32)
Calcium: 9.6 mg/dL (ref 8.9–10.3)
Chloride: 104 mmol/L (ref 101–111)
Creatinine, Ser: 1.2 mg/dL (ref 0.61–1.24)
GFR calc Af Amer: >60 mL/min (ref >60)
GFR calc non Af Amer: >60 mL/min (ref >60)
Glucose, Bld: 98 mg/dL (ref 65–99)
Potassium: 4.6 mmol/L (ref 3.5–5.1)
Sodium: 139 mmol/L (ref 135–145)

## 2014-12-06 LAB — ETHANOL: Alcohol, Ethyl (B): <5 mg/dL (ref <5)

## 2014-12-06 MED ORDER — LORAZEPAM 1 MG PO TABS: 1.0000 mg | ORAL_TABLET | Freq: Three times a day (TID) | ORAL | Status: DC | PRN

## 2014-12-06 MED ORDER — TRAZODONE HCL 50 MG PO TABS
50.0000 mg | ORAL_TABLET | Freq: Every evening | ORAL | Status: DC | PRN
  Filled 2014-12-06: qty 14

## 2014-12-06 MED ORDER — ONDANSETRON HCL 4 MG PO TABS: 4.0000 mg | ORAL_TABLET | Freq: Three times a day (TID) | ORAL | Status: DC | PRN

## 2014-12-06 MED ORDER — NICOTINE 21 MG/24HR TD PT24
21.0000 mg | MEDICATED_PATCH | Freq: Every day | TRANSDERMAL | Status: DC
  Administered 2014-12-06: 21 mg via TRANSDERMAL
  Filled 2014-12-06: qty 1

## 2014-12-06 MED ORDER — ALUM & MAG HYDROXIDE-SIMETH 200-200-20 MG/5ML PO SUSP: 30.0000 mL | ORAL | Status: DC | PRN

## 2014-12-06 MED ORDER — MAGNESIUM HYDROXIDE 400 MG/5ML PO SUSP: 30.0000 mL | Freq: Every day | ORAL | Status: DC | PRN

## 2014-12-06 MED ORDER — HYDROXYZINE HCL 25 MG PO TABS
25.0000 mg | ORAL_TABLET | Freq: Four times a day (QID) | ORAL | Status: DC | PRN
  Administered 2014-12-06: 25 mg via ORAL
  Filled 2014-12-06: qty 20
  Filled 2014-12-06: qty 1

## 2014-12-06 MED ORDER — ZOLPIDEM TARTRATE 5 MG PO TABS: 5.0000 mg | ORAL_TABLET | Freq: Every evening | ORAL | Status: DC | PRN

## 2014-12-06 MED ORDER — ACETAMINOPHEN 325 MG PO TABS
650.0000 mg | ORAL_TABLET | Freq: Four times a day (QID) | ORAL | Status: DC | PRN
  Administered 2014-12-07 – 2014-12-08 (×2): 650 mg via ORAL
  Filled 2014-12-06 (×2): qty 2

## 2014-12-06 MED ORDER — ACETAMINOPHEN 325 MG PO TABS: 650.0000 mg | ORAL_TABLET | ORAL | Status: DC | PRN

## 2014-12-06 MED ORDER — IBUPROFEN 400 MG PO TABS: 600.0000 mg | ORAL_TABLET | Freq: Three times a day (TID) | ORAL | Status: DC | PRN
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
## 2014-12-06 NOTE — Progress Notes (Signed)
Adult Psychoeducational Group Note  Date:  12/06/2014 Time:  10:57 PM  Group Topic/Focus:  Wrap-Up Group:   The focus of this group is to help patients review their daily goal of treatment and discuss progress on daily workbooks.  Participation Level:  Active  Participation Quality:  Appropriate  Affect:  Appropriate  Cognitive:  Appropriate  Insight: Appropriate  Engagement in Group:  Engaged  Modes of Intervention:  Discussion  Additional Comments:  Pt denied having any thoughts of harm self or anyone else.  Debbora Ang C 12/06/2014, 10:57 PM 

## 2014-12-06 NOTE — Progress Notes (Signed)
Admission note: Pt presents with flat affect and depressed mood. Pt endorses suicidal ideations and verbally contracts for safety. Pt feeling depressed due to being homeless and feeling like a burden. Pt reports using occasional THC and alcohol. Pt reported that he do not want his meds changed and would like to continue taking Zoloft, Zyprexa, klonopin, and Zyprexa. Gaspar ColaSheila May, NP made aware.   Pt v/s taken, belongings searched, skin assessed and required documents signed.

## 2014-12-06 NOTE — ED Notes (Signed)
TTS at bedside. 

## 2014-12-06 NOTE — Progress Notes (Addendum)
Seeking inpt placement for pt as recommended by Wyn Forster. Kober, PA. Cons[facility] upon bed availability.  Faxed referral to [facility] (for review for waitlist) and [facility] ("Might have one bed open later today"). All other MH/SA inpatient psychiatric facilities contacted are at capacity today and not accepting referrals. [facility], [facility], [facility], [facility], [facility], [facility], [facility], [facility], [facility], [facility]; [facility] cannot accept referral due to pt's insurance status)  Ilean SkillMeghan Zeola Brys, MSW, LCSWA Clinical Social Work, Disposition  12/06/2014 [phone]

## 2014-12-06 NOTE — ED Notes (Signed)
Behavioral Health called stated patient will be admitted inpatient.
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
## 2014-12-06 NOTE — Progress Notes (Signed)
Patient ID: Darrell Espinoza, male   DOB: 01-08-1981, 34 y.o.   MRN: [id number] D: Client in bed complains of anxiety. Client reports "I smoked weed, I was clean for three years"  A: Writer introduced self to client,  administered Hydroxyzine 25 mg po, encouraged client to deep breathe. Air adjusted as room was warm. Staff will monitor q7415min for safety. R: Client is safe on the unit, did not attend group.

## 2014-12-06 NOTE — ED Notes (Signed)
Sat with pt from 0920 to 1120 when new sitter showed up.

## 2014-12-06 NOTE — Discharge Instructions (Signed)
Transfer to [facility] 

## 2014-12-06 NOTE — ED Provider Notes (Signed)
BH team [facility] Dr Dub MikesLugo.  Pt content, alert, nad.   Filed Vitals:   12/06/14 1216  BP: 123/84  Pulse: 65  Temp: 98.4 F (36.9 C)  Resp: 18   Pt appears stable for transfer.     Cathren LaineKevin Keyuana Wank, MD 12/06/14 [phone]

## 2014-12-06 NOTE — ED Notes (Signed)
Ordered diet tray 

## 2014-12-06 NOTE — Tx Team (Signed)
Initial Interdisciplinary Treatment Plan   PATIENT STRESSORS: Medication change or noncompliance Substance abuse   PATIENT STRENGTHS: Ability for insight Capable of independent living Motivation for treatment/growth   PROBLEM LIST: Problem List/Patient Goals Date to be addressed Date deferred Reason deferred Estimated date of resolution  Shelter (homeless) 12/06/14     Med compliance  12/06/14                                                DISCHARGE CRITERIA:  Ability to meet basic life and health needs Adequate post-discharge living arrangements Improved stabilization in mood, thinking, and/or behavior  PRELIMINARY DISCHARGE PLAN: Attend aftercare/continuing care group Attend PHP/IOP  PATIENT/FAMIILY INVOLVEMENT: This treatment plan has been presented to and reviewed with the patient, Darrell Espinoza, and/or family member.  The patient and family have been given the opportunity to ask questions and make suggestions.  Darrell Espinoza L 12/06/2014, 5:48 PM

## 2014-12-06 NOTE — Progress Notes (Signed)
[facility] by Dr. Dub MikesLugo per [place], [facility] Admission is voluntary. Can be transported when ready.   Spoke with [facility] RN re: pt's placement.  Ilean SkillMeghan Emberley Kral, MSW, LCSWA Clinical Social Work, Disposition  12/06/2014 [phone]

## 2014-12-06 NOTE — ED Notes (Addendum)
Pt states "wanted to take a whole bottle of pills last night and drink a bottle of liquor-- I am to the point that I am not living like this anymore" pt states he is homeless, hx of anxiety-- denies drug use-- shaking all over-- states is anxious-- has been staying with a friend past few weeks.

## 2014-12-07 ENCOUNTER — Other Ambulatory Visit: Payer: Self-pay

## 2014-12-07 ENCOUNTER — Encounter (HOSPITAL_COMMUNITY): Payer: Self-pay | Admitting: Psychiatry

## 2014-12-07 DIAGNOSIS — F1910 Other psychoactive substance abuse, uncomplicated: Secondary | ICD-10-CM | POA: Diagnosis present

## 2014-12-07 DIAGNOSIS — R45851 Suicidal ideations: Secondary | ICD-10-CM

## 2014-12-07 DIAGNOSIS — F410 Panic disorder [episodic paroxysmal anxiety] without agoraphobia: Secondary | ICD-10-CM

## 2014-12-07 MED ORDER — OLANZAPINE 2.5 MG PO TABS
2.5000 mg | ORAL_TABLET | Freq: Two times a day (BID) | ORAL | Status: DC
  Administered 2014-12-07 – 2014-12-10 (×6): 2.5 mg via ORAL
  Filled 2014-12-07 (×10): qty 1

## 2014-12-07 MED ORDER — SERTRALINE HCL 25 MG PO TABS
25.0000 mg | ORAL_TABLET | Freq: Every day | ORAL | Status: DC
  Administered 2014-12-07 – 2014-12-10 (×4): 25 mg via ORAL
  Filled 2014-12-07 (×7): qty 1

## 2014-12-07 MED ORDER — OLANZAPINE 5 MG PO TABS
5.0000 mg | ORAL_TABLET | Freq: Every day | ORAL | Status: DC
  Administered 2014-12-08 – 2014-12-09 (×3): 5 mg via ORAL
  Filled 2014-12-07 (×5): qty 1

## 2014-12-07 MED ORDER — NICOTINE 21 MG/24HR TD PT24
21.0000 mg | MEDICATED_PATCH | Freq: Every day | TRANSDERMAL | Status: DC
Start: 2014-12-07 — End: 2014-12-11
  Administered 2014-12-07 – 2014-12-11 (×5): 21 mg via TRANSDERMAL
  Filled 2014-12-07 (×7): qty 1

## 2014-12-07 MED ORDER — BENZOCAINE 10 % MT GEL
Freq: Three times a day (TID) | OROMUCOSAL | Status: DC | PRN
  Administered 2014-12-07: 16:00:00 via OROMUCOSAL
  Filled 2014-12-07: qty 9.4

## 2014-12-07 MED ORDER — LORAZEPAM 2 MG/ML IJ SOLN
1.0000 mg | Freq: Once | INTRAMUSCULAR | Status: AC
  Administered 2014-12-07: 1 mg via INTRAMUSCULAR

## 2014-12-07 MED ORDER — LORAZEPAM 2 MG/ML IJ SOLN
INTRAMUSCULAR | Status: AC
  Filled 2014-12-07: qty 1

## 2014-12-07 MED ORDER — LORAZEPAM 1 MG PO TABS: 1.0000 mg | ORAL_TABLET | Freq: Once | ORAL | Status: DC

## 2014-12-07 MED ORDER — CLONAZEPAM 1 MG PO TABS
1.0000 mg | ORAL_TABLET | Freq: Two times a day (BID) | ORAL | Status: DC | PRN
  Administered 2014-12-07 – 2014-12-11 (×8): 1 mg via ORAL
  Filled 2014-12-07 (×9): qty 1
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
## 2014-12-07 NOTE — [facility] Group Notes (Signed)
   [facility] L[facility] Aftercare Discharge Planning Group Note  12/07/2014  8:45 AM   Participation Quality: Alert, Appropriate and Oriented  Mood/Affect: Depressed and Flat  Depression Rating: 6  Anxiety Rating: "It's always high"  Thoughts of Suicide: Pt denies SI/HI  Will you contract for safety? Yes  Current AVH: Pt denies  Plan for Discharge/Comments: Pt attended discharge planning group and actively participated in group. CSW provided pt with today's workbook. Patient reports being homeless and unable to afford his outpatient or medication copays. He denies having any supports or income.  Transportation Means: CSW continuing to assess.  Supports: No supports mentioned at this time  Samuella BruinKristin Jacole Capley, MSW, [employer]CSWA Clinical Social Worker [facility] [phone]

## 2014-12-07 NOTE — Progress Notes (Signed)
Patient ID: Darrell Espinoza, male   DOB: December 17, 1980, 34 y.o.   MRN: [id number] D: Client in bed most of the shift, alert to verbal stimulation, reports anxiety at "5" of 10. Denies SHI, "been to one group this morning" A: Writer provided emotional support, encouraged group and medication. Staff will monitor q9015min for safety. R: Client is safe on the unit, did not attend group.

## 2014-12-07 NOTE — Progress Notes (Signed)
D:  Patient denied SI and HI this morning, contracts for safety.  Denied A/V hallucinations.   A:  Medications administered per MD orders.  Emotional support and encouragement given patient. R:  Safety maintained with 15 minute checks. Patient stated he needs dental work, cavities.  Tylenol given for pain.   Patient has been sleeping in bed.  Respirations even and unlabored.  No signs/symptoms of pain/distress noted on patient's face/body movements.

## 2014-12-07 NOTE — Plan of Care (Signed)
Problem: Consults Goal: Depression Patient Education See Patient Education Module for education specifics.  Outcome: Progressing Nurse discussed depression/coping skills with patient.        

## 2014-12-07 NOTE — [facility] Suicide Risk Assessment (Signed)
[facility] INPATIENT:  Family/Significant Other Suicide Prevention Education  Suicide Prevention Education:  Patient Refusal for Family/Significant Other Suicide Prevention Education: The patient Darrell Espinoza has refused to provide written consent for family/significant other to be provided Family/Significant Other Suicide Prevention Education during admission and/or prior to discharge.  Physician notified. SPE reviewed with patient and brochure provided. Patient encouraged to return to hospital if having suicidal thoughts, patient verbalized his/her understanding and has no further questions at this time.   [name], [place]boKristin L 12/07/2014, 2:46 PM

## 2014-12-07 NOTE — Tx Team (Signed)
Interdisciplinary Treatment Plan Update (Adult) Date: 12/07/2014   Time Reviewed: 9:30 AM  Progress in Treatment: Attending groups: Continuing to assess, patient new to milieu. Participating in groups: Continuing to assess, patient new to milieu. Taking medication as prescribed: Yes Tolerating medication: Yes Family/Significant other contact made: No, CSW assessing for appropriate contacts Patient understands diagnosis: Yes Discussing patient identified problems/goals with staff: Yes Medical problems stabilized or resolved: Yes Denies suicidal/homicidal ideation: Yes Issues/concerns per patient self-inventory: Yes Other:  New problem(s) identified: N/A  Discharge Plan or Barriers:  6/3: Continuing to assess, patient new to milieu.  Reason for Continuation of Hospitalization:  Depression Anxiety Medication Stabilization   Comments: N/A  Estimated length of stay: 3-5 days  For review of initial/current patient goals, please see plan of care. Patient is a 34 year old Male admitted for SI with plan to overdose. Stressors include homelessness, finances, and unemployment. [facility] [place], therapist is Jennette BankerBrian Tuttle. [facility]. Patient will benefit from crisis stabilization, medication evaluation, group therapy, and psycho education in addition to case management for discharge planning. Patient and CSW reviewed pt's identified goals and treatment plan. Pt verbalized understanding and agreed to treatment plan.   Attendees: Patient:    Family:    Physician: Dr. Jama Flavorsobos; Dr. Dub MikesLugo; Dr. Elna BreslowEappen 12/07/2014 9:30 AM  Nursing: Quintella ReichertBeverly Knight, Melissa NoonKaren Shugart, Vickki MuffSara Twyman, Amanda Lawson, RN 12/07/2014 9:30 AM  Clinical Social Worker: Samuella BruinKristin Lysbeth Dicola, LCSWA 12/07/2014 9:30 AM  Other: Juline PatchQuylle Hodnett, LCSW 12/07/2014 9:30 AM  Other: Leisa LenzValerie Enoch, Vesta [facility]  Liaison 12/07/2014 9:30 AM  Other: Onnie BoerJennifer Clark, Case Manager 12/07/2014 9:30 AM  Other: Mosetta AnisAggie Nwoko, Laura Davis NP 12/07/2014 9:30 AM  Other: Chad CordialLauren Carter, LCSWA 12/07/2014 9:30 AM  Other:                         Scribe for Treatment Team:  Samuella BruinKristin Sebastiana Wuest, MSW, [employer]CSWA [phone]

## 2014-12-07 NOTE — [facility] Suicide Risk Assessment (Signed)
[facility]   Nursing information obtained from:  Patient Demographic factors:  Male, Caucasian, Living alone, Unemployed Current Mental Status:  Suicidal ideation indicated by patient, Self-harm thoughts, Self-harm behaviors Loss Factors:  Financial problems / change in socioeconomic status Historical Factors:  Prior suicide attempts Risk Reduction Factors:  Positive coping skills or problem solving skills Total Time spent with patient: 30 minutes Principal Problem: <principal problem not specified> Diagnosis:   Patient Active Problem List   Diagnosis Date Noted  . Depressed [F32.9] 12/06/2014  . Suicidal ideations [R45.851] 06/08/2014  . Generalized anxiety disorder [F41.1] 06/08/2014  . Posttraumatic stress disorder [F43.10] 07/11/2013  . Major depressive disorder, recurrent episode, severe [F33.2] 07/11/2013  . Benzodiazepine abuse [F13.10] 07/11/2013  . Bipolar 1 disorder [F31.9] 07/08/2013  . Alcohol abuse [F10.10] 07/08/2013  . Alcohol dependence [F10.20] 07/08/2013  . Alcohol withdrawal [F10.239] 07/08/2013     Continued Clinical Symptoms:  Alcohol Use Disorder Identification Test Final Score (AUDIT): 1 The "Alcohol Use Disorders Identification Test", Guidelines for Use in Primary Care, Second Edition.  World Science writerHealth Organization [facility](WHO). Score between 0-7:  no or low risk or alcohol related problems. Score between 8-15:  moderate risk of alcohol related problems. Score between 16-19:  high risk of alcohol related problems. Score 20 or above:  warrants further diagnostic evaluation for alcohol dependence and treatment.   CLINICAL FACTORS:   Severe Anxiety and/or Agitation Panic Attacks Depression:   Impulsivity Alcohol/Substance Abuse/Dependencies  Psychiatric Specialty Exam: Physical Exam  ROS  Blood pressure 114/75, pulse 88, temperature 98.7 F (37.1 C), temperature source Oral, resp. rate 16, height 5\' 11"  (1.803 m), weight 69.854 kg  (154 lb).Body mass index is 21.49 kg/(m^2).   COGNITIVE FEATURES THAT CONTRIBUTE TO RISK:  Closed-mindedness, Polarized thinking and Thought constriction (tunnel vision)    SUICIDE RISK:   Moderate:  Frequent suicidal ideation with limited intensity, and duration, some specificity in terms of plans, no associated intent, good self-control, limited dysphoria/symptomatology, some risk factors present, and identifiable protective factors, including available and accessible social support.  PLAN OF CARE: Supportive approach/copign skills                               Panic anxiety-depression; will resume his previous medication regime and optimize response                               CBT/mndfulness                                Reassess the extend of his substance abuse  Medical Decision Making:  Review of Psycho-Social Stressors (1), Review or order clinical lab tests (1), Review of Medication Regimen & Side Effects (2) and Review of New Medication or Change in Dosage (2)  I certify that inpatient services furnished can reasonably be expected to improve the patient's condition.   Darrell Espinoza A 12/07/2014, 5:59 PM

## 2014-12-07 NOTE — Progress Notes (Signed)
Patient returned from dinner in dining room early.  Patient went to bed, stated he did not feel good, ginger ale given patient.  Patient came to nursing station, trembling, stated he did not feel right in his lungs/chest.  VS taken.  MD ordered EKG.  3 EKG's completed and given to MD and NP's to review.  MD ordered ativan 1 mg IM once which was given to patient.  Patient returned to bed.  Later patient received phone call and patient walked to phone in hallway.  No signs/symptoms of pain/distress noted on patient's face or body movements.  Respirations even and unlabored.  Patient stated he was feeling better.  Safety maintained with 15 minute checks.

## 2014-12-07 NOTE — [facility] Group Notes (Signed)
[facility] LCSW Group Therapy 12/07/2014 1:15 PM Type of Therapy: Group Therapy Participation Level: Active  Participation Quality: Attentive, Sharing and Supportive  Affect: Depressed and Flat  Cognitive: Alert and Oriented  Insight: Developing/Improving and Engaged  Engagement in Therapy: Developing/Improving and Engaged  Modes of Intervention: Clarification, Confrontation, Discussion, Education, Exploration, Limit-setting, Orientation, Problem-solving, Rapport Building, Dance movement psychotherapisteality Testing, Socialization and Support  Summary of Progress/Problems: The topic for today was feelings about relapse. Pt discussed what relapse prevention is to them and identified triggers that they are on the path to relapse. Pt processed their feeling towards relapse and was able to relate to peers. Pt discussed coping skills that can be used for relapse prevention. Patient reported feeling "alone", reporting a lack of social supports and experiencing many negative stressors in his life. Patient was asking other group members for guidance in how to overcome his obstacles. CSW and several other group members provided patient with emotional support and words of encouragement.   Samuella BruinKristin Norfleet Capers, MSW, [employer]CSWA Clinical Social Worker [facility] [phone]

## 2014-12-07 NOTE — Progress Notes (Signed)
Recreation Therapy Notes  Date: 06.03.16 Time: 9:30am Location: 300 Group Room  Group Topic: Stress Management  Goal Area(s) Addresses:  Patient will verbalize importance of using healthy stress management.  Patient will identify positive emotions associated with healthy stress management.   Intervention: Stress Management  Activity :  Progressive Muscle Relaxation.  LRT introduced patients to stress management technique of progressive muscle relaxation.  A script was used to deliver the technique and patients were asked to follow script read by LRT to engage in practicing the stress management technique.    Education:  Stress Management, Discharge Planning.   Education Outcome: Acknowledges edcuation/In group clarification offered  Clinical Observations/Feedback: Patient did not attend group.    Caroll RancherMarjette Shaolin Armas, LRT/CTRS         Lillia AbedLindsay, Yee Gangi A 12/07/2014 1:40 PM
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
## 2014-12-08 DIAGNOSIS — F1990 Other psychoactive substance use, unspecified, uncomplicated: Secondary | ICD-10-CM

## 2014-12-08 DIAGNOSIS — F411 Generalized anxiety disorder: Secondary | ICD-10-CM

## 2014-12-08 DIAGNOSIS — F332 Major depressive disorder, recurrent severe without psychotic features: Principal | ICD-10-CM

## 2014-12-08 MED ORDER — METHOCARBAMOL 500 MG PO TABS
500.0000 mg | ORAL_TABLET | Freq: Four times a day (QID) | ORAL | Status: DC | PRN
  Administered 2014-12-08: 500 mg via ORAL
  Filled 2014-12-08: qty 1

## 2014-12-08 MED ORDER — PROPRANOLOL HCL 10 MG PO TABS
10.0000 mg | ORAL_TABLET | Freq: Three times a day (TID) | ORAL | Status: DC
  Administered 2014-12-08 – 2014-12-11 (×9): 10 mg via ORAL
  Filled 2014-12-08 (×2): qty 1
  Filled 2014-12-08: qty 42
  Filled 2014-12-08 (×6): qty 1
  Filled 2014-12-08: qty 42
  Filled 2014-12-08 (×2): qty 1
  Filled 2014-12-08: qty 42
  Filled 2014-12-08 (×2): qty 1

## 2014-12-08 MED ORDER — IBUPROFEN 800 MG PO TABS
800.0000 mg | ORAL_TABLET | Freq: Four times a day (QID) | ORAL | Status: DC | PRN
  Administered 2014-12-11: 800 mg via ORAL
  Filled 2014-12-08: qty 1

## 2014-12-08 NOTE — Progress Notes (Signed)
D.  Pt in bed upon approach, denies complaints at this time.  Pt did not attend evening AA group, remained in bed.  Denies SI/HI/hallucinations at this time.  Minimal interaction this shift.  A.  Support and encouragement offered, medications given as ordered  R.  Pt remains safe on unit, will continue to monitor.

## 2014-12-08 NOTE — Progress Notes (Signed)
Pt did not attend group this evening.  

## 2014-12-08 NOTE — [facility] Group Notes (Signed)
[facility] Group Notes: (Clinical Social Work)   12/08/2014      Type of Therapy:  Group Therapy   Participation Level:  Did Not Attend despite MHT prompting   Ambrose MantleMareida Grossman-Orr, LCSW 12/08/2014, 12:47 PM

## 2014-12-08 NOTE — [facility] Group Notes (Signed)
The focus of this group is to educate the patient on the purpose and policies of crisis stabilization and provide a format to answer questions about their admission.  The group details unit policies and expectations of patients while admitted.  Patient attended 0900 nurse education orientation group this morning.  Patient actively participated, appropriate affect, alert, appropriate insight and engagement.  
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
## 2014-12-08 NOTE — Progress Notes (Signed)
D: Pt's mood is depressed. He rates his depression 5/10, hopelessness 9/10, and anxiety 7/10. Denies SI/HI/AVH.  A: Support given. Verbalization encouraged. Pt encouraged to come to staff for any concerns. Medications given as prescribed. R: Pt is receptive. No complaints of pain or discomfort at this time. Q15 min safety checks maintained. Pt remains safe on the unit. Will continue to monitor.

## 2014-12-09 MED ORDER — PANTOPRAZOLE SODIUM 40 MG PO TBEC
40.0000 mg | DELAYED_RELEASE_TABLET | Freq: Every day | ORAL | Status: DC
  Administered 2014-12-09 – 2014-12-11 (×3): 40 mg via ORAL
  Filled 2014-12-09 (×5): qty 1
  Filled 2014-12-09: qty 14
  Filled 2014-12-09: qty 1

## 2014-12-09 NOTE — Progress Notes (Signed)
Patient ID: Adela PortsJames Sosinski, male   DOB: Aug 09, 1980, 34 y.o.   MRN: [id number]  Adult Psychoeducational Group Note  Date:  12/09/2014 Time:  0900  Group Topic/Focus:  Making Healthy Choices:   The focus of this group is to help patients identify negative/unhealthy choices they were using prior to admission and identify positive/healthier coping strategies to replace them upon discharge.  Participation Level:  Did Not Attend  Participation Quality: n/a  Affect: n/a  Cognitive: n/a  Insight: n/a  Engagement in Group: n/a  Modes of Intervention:  Discussion, Education, Role-play and Support  Additional Comments:  Pt did not attend group. Pt was in bed asleep.   Aurora Maskwyman, Ellyanna Holton E 12/09/2014, 9:59 AM

## 2014-12-09 NOTE — [facility] Group Notes (Signed)
[facility] Group Notes:  (Clinical Social Work)  12/09/2014  10:00-11:00AM  Summary of Progress/Problems:   The main focus of today's process group was to   1)  discuss the importance of adding supports  2)  define health supports versus unhealthy supports  3)  identify the patient's current unhealthy supports and plan how to handle them  4)  Identify the patient's current healthy supports and plan what to add.  An emphasis was placed on using counselor, doctor, therapy groups, 12-step groups, and problem-specific support groups to expand supports.    The patient expressed full comprehension of the concepts presented, and agreed that there is a need to add more supports.  The patient started a discussion about the need for marijuana to be legalized, and how angry he is that doctors dismiss him/other patients if they have used marijuana for pain.  Later he engaged CSW and group in discussion about his feeling that AA meetings make him want to use, and how he gets angry when people who have been sober a long time call themselves alcoholics.  Other group members gently but firmly talked about the reason for this, and how the patient is in danger of relapse with such resistance to admitting he has a problem, but that if drugs/alcohol change his demeanor and have been a problem in his life, he is an addict and needs support for such.  He was still resistant, but polite, willing to talk to them after group.  Type of Therapy:  Process Group with Motivational Interviewing  Participation Level:  Active  Participation Quality:  Attentive, Resistant and Sharing  Affect:  Irritable  Cognitive:  Alert  Insight:  Developing/Improving  Engagement in Therapy:  Engaged  Modes of Intervention:   Education, Support and Processing, Activity  Ambrose MantleMareida Grossman-Orr, LCSW 12/09/2014

## 2014-12-09 NOTE — Progress Notes (Signed)
Patient did attend the first half of the evening speaker AA meeting and then returned to his bed.

## 2014-12-09 NOTE — Progress Notes (Signed)
Patient ID: Darrell PortsJames Fair, male   DOB: 23-Jul-1980, 34 y.o.   MRN: [id number]   D: Pt has been appropriate on the unit today, he has attended all groups and engaged in treatment. Pt reported that he was very energized, and that he thought that the Zyprexa was help but it is not touching him. Pt reported that maybe he was energized because he was drinking a lot of coffee. Pt reported that his depression was a 8, his hopelessness was a 10, and that his anxiety was a 6. Pt reported that his goal was to make it through.  Pt reported being negative SI/HI, no AH/VH noted. A: 15 min checks continued for patient safety. R: Pt safety maintained.
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
## 2014-12-10 MED ORDER — SERTRALINE HCL 25 MG PO TABS
25.0000 mg | ORAL_TABLET | Freq: Once | ORAL | Status: AC
  Administered 2014-12-10: 25 mg via ORAL
  Filled 2014-12-10 (×2): qty 1

## 2014-12-10 MED ORDER — OLANZAPINE 5 MG PO TABS
5.0000 mg | ORAL_TABLET | Freq: Two times a day (BID) | ORAL | Status: DC
Start: 2014-12-10 — End: 2014-12-11
  Administered 2014-12-10 – 2014-12-11 (×2): 5 mg via ORAL
  Filled 2014-12-10 (×2): qty 28
  Filled 2014-12-10 (×4): qty 1

## 2014-12-10 MED ORDER — SERTRALINE HCL 50 MG PO TABS
50.0000 mg | ORAL_TABLET | Freq: Every day | ORAL | Status: DC
  Administered 2014-12-11: 50 mg via ORAL
  Filled 2014-12-10: qty 14
  Filled 2014-12-10 (×2): qty 1

## 2014-12-10 MED ORDER — OLANZAPINE 2.5 MG PO TABS
2.5000 mg | ORAL_TABLET | Freq: Every day | ORAL | Status: DC
  Filled 2014-12-10 (×2): qty 1
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
## 2014-12-10 NOTE — Progress Notes (Signed)
D.  Pt pleasant on approach, denies complaints at this time.  Pleased that his Alvin CritchleyKanka brush was located, states it works better than Orajel for mouth pain.  Positive for evening AA group, did leave after 30 minutes to go to bed.  Interacting appropriately with peers on the unit.  Denies SI/HI/hallucinations at this time.  A.  Support and encouragement offered  R.  Pt remains safe on unit, will continue to monitor.

## 2014-12-10 NOTE — Plan of Care (Signed)
Problem: Ineffective individual coping Goal: STG: Patient will remain free from self harm Outcome: Progressing Patient remains free from self harm. 15 minute checks continued per protocol for patient safety.   Problem: Diagnosis: Increased Risk For Suicide Attempt Goal: STG-Patient Will Comply With Medication Regime Outcome: Progressing Patient has adhered to medication regimen today with ease.  Problem: Alteration in mood Goal: LTG-Patient reports reduction in suicidal thoughts (Patient reports reduction in suicidal thoughts and is able to verbalize a safety plan for whenever patient is feeling suicidal)  Outcome: Progressing Patient denies having any suicidal thoughts today.

## 2014-12-10 NOTE — Progress Notes (Signed)
Adult Psychoeducational Group Note  Date:  12/10/2014 Time:  2000  Group Topic/Focus:  AA  Participation Level:  Active  Participation Quality:  Appropriate  Affect:  Appropriate  Cognitive:  Appropriate  Insight: Appropriate  Engagement in Group:  Engaged  Modes of Intervention:  Discussion  Additional Comments:  Pt attended AA group.   Michalle Rademaker Chanel 12/10/2014, 11:51 PM

## 2014-12-10 NOTE — [facility] Group Notes (Signed)
   [facility]  12/10/2014  8:45 AM   Participation Quality: Alert, Appropriate and Oriented  Mood/Affect: Anxious  Depression Rating: 3  Anxiety Rating: 5  Thoughts of Suicide: Pt denies SI/HI  Will you contract for safety? Yes  Current AVH: Pt denies  Plan for Discharge/Comments: Pt attended discharge planning group and actively participated in group. CSW provided pt with today's workbook. Patient reports feeling "hyperactive" today but reports an improvement in his depressive symptoms. He reports that his medications are working well for him. Patient continues to be homeless and unsure of where he will stay at discharge.  Transportation Means: CSW continuing to assess.  Supports: No supports mentioned at this time  Samuella BruinKristin Asenath Balash, MSW, [employer]CSWA Clinical Social Worker [facility] [phone]

## 2014-12-10 NOTE — Progress Notes (Signed)
Recreation Therapy Notes  Date: 06.06.16 Time: 9:30 am Location: [address] Hall Group Room  Group Topic: Coping Skills  Goal Area(s) Addresses:  Patient will verbalize importance of using healthy stress management. Patient will identify positive emotions associated with healthy stress management.  Behavioral Response:  Engaged  Intervention: Stress Management  Activity: Healthy Relaxation Guided Imagery.  LRT introduced and educated patients on stress management technique of guided imagery.  Script was used to deliver the technique to patients.  Patients were asked to follow script read aloud by LRT to engage in practicing the stress management technique.   Education: PharmacologistCoping Skills, Building control surveyorDischarge Planning.   Education Outcome: Acknowledges understanding/In group clarification offered/Needs additional education.   Clinical Observations/Feedback: Patient attended group.    Caroll RancherMarjette Lasheka Kempner, LRT/CTRS         Caroll RancherLindsay, Hargis Vandyne A 12/10/2014 3:36 PM
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
## 2014-12-11 DIAGNOSIS — F332 Major depressive disorder, recurrent severe without psychotic features: Secondary | ICD-10-CM | POA: Insufficient documentation

## 2014-12-11 MED ORDER — HYDROXYZINE HCL 25 MG PO TABS: 25.0000 mg | ORAL_TABLET | Freq: Four times a day (QID) | ORAL | Status: AC | PRN

## 2014-12-11 MED ORDER — CLONAZEPAM 1 MG PO TABS: 1.0000 mg | ORAL_TABLET | Freq: Two times a day (BID) | ORAL | Status: AC | PRN

## 2014-12-11 MED ORDER — OLANZAPINE 5 MG PO TABS: 5.0000 mg | ORAL_TABLET | Freq: Two times a day (BID) | ORAL | Status: AC

## 2014-12-11 MED ORDER — BENZOCAINE 10 % MT GEL: Freq: Three times a day (TID) | OROMUCOSAL | Status: AC | PRN

## 2014-12-11 MED ORDER — NICOTINE 21 MG/24HR TD PT24: 21.0000 mg | MEDICATED_PATCH | Freq: Every day | TRANSDERMAL | Status: AC

## 2014-12-11 MED ORDER — TRAZODONE HCL 50 MG PO TABS: 50.0000 mg | ORAL_TABLET | Freq: Every evening | ORAL | Status: AC | PRN

## 2014-12-11 MED ORDER — CLONAZEPAM 1 MG PO TABS
1.0000 mg | ORAL_TABLET | Freq: Once | ORAL | Status: AC
  Administered 2014-12-11: 1 mg via ORAL

## 2014-12-11 MED ORDER — PROPRANOLOL HCL 10 MG PO TABS: 10.0000 mg | ORAL_TABLET | Freq: Three times a day (TID) | ORAL | Status: AC

## 2014-12-11 MED ORDER — SERTRALINE HCL 50 MG PO TABS: 50.0000 mg | ORAL_TABLET | Freq: Every day | ORAL | Status: AC

## 2014-12-11 NOTE — Tx Team (Signed)
Interdisciplinary Treatment Plan Update (Adult) Date: 12/07/2014   Time Reviewed: 9:30 AM  Progress in Treatment: Attending groups: Yes Participating in groups: Yes Taking medication as prescribed: Yes Tolerating medication: Yes Family/Significant other contact made: No, patient has declined for collateral contact Patient understands diagnosis: Yes Discussing patient identified problems/goals with staff: Yes Medical problems stabilized or resolved: Yes Denies suicidal/homicidal ideation: Yes Issues/concerns per patient self-inventory: Yes Other:  New problem(s) identified: N/A  Discharge Plan or Barriers:  6/3: Patient will likely discharge to shelter to [facility]. Patient is homeless and has no income or supports.  Reason for Continuation of Hospitalization:  Depression Anxiety Medication Stabilization   Comments: N/A  Estimated length of stay: 1-2 days  For review of initial/current patient goals, please see plan of care. Patient is a 34 year old Male admitted for SI with plan to overdose. Stressors include homelessness, finances, and unemployment. Patient currently follows up at [facility] [place], therapist is Jennette BankerBrian Tuttle. [facility]. Patient will benefit from crisis stabilization, medication evaluation, group therapy, and psycho education in addition to case management for discharge planning. Patient and CSW reviewed pt's identified goals and treatment plan. Pt verbalized understanding and agreed to treatment plan.    Attendees: Patient:    Family:    Physician: Dr. Jama Flavorsobos; Dr. Dub MikesLugo; Dr. Elna BreslowEappen 12/11/2014 9:30 AM  Nursing: Nadean CorwinKim Maggio, Christa AmagonDobson, Dario GuardianBrittany Guthrie, Janet Webb, and [place]rice White, [place] 12/11/2014 9:30 AM  Clinical Social Worker: Samuella BruinKristin Joffrey Kerce, LCSWA 12/11/2014 9:30 AM  Other: Juline PatchQuylle Hodnett, LCSW 12/11/2014 9:30  AM  Other: Leisa LenzValerie Enoch, Vesta MixerMonarch Liaison 12/11/2014 9:30 AM  Other: Onnie BoerJennifer Clark, Case Manager 12/11/2014 9:30 AM  Other: Serena ColonelAggie Nwoko, NP 12/11/2014 9:30 AM  Other: Chad CordialLauren Carter, LCSWA 12/11/2014 9:30 AM  Other:                                                  Scribe for Treatment Team:  Samuella BruinKristin Reanna Scoggin, MSW, [employer] IncLCSWA [phone]

## 2014-12-11 NOTE — Progress Notes (Signed)
Pt received both written and verbal discharge instructions. Pt verbalized understanding of discharge instructions. Pt agreed to f/u appt and med regimen. Pt received sample meds, prescriptions and discharge summary. Pt received belongings from room and locker #20. Pt safely d/c'd to lobby.
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
## 2014-12-11 NOTE — [facility] Group Notes (Signed)
[facility] 12/11/2014 1:15pm  Type of Therapy: Mental Health Association Presentation  Participation Level: Active  Participation Quality: Attentive  Affect: Appropriate  Cognitive: Oriented  Insight: Developing/Improving  Engagement in Therapy: Engaged  Modes of Intervention: Discussion, Education and Socialization  Summary of Progress/Problems: Mental Health Association (MHA) Speaker came to talk about his personal journey with substance abuse and addiction. The pt processed ways by which to relate to the speaker. MHA speaker provided handouts and educational information pertaining to groups and services offered by the [facility]. Pt was engaged in speaker's presentation and participated in discussion. Pt expressed that speaker helped provide new prospective on recovery process. He was receptive to resources provided by speaker.   Chad CordialLauren Carter, LCSWA 12/11/2014 1:40 PM

## 2014-12-11 NOTE — [facility] Group Notes (Signed)
[facility] Group Notes:  (Nursing/MHT/Case Management/Adjunct)  Date:  12/11/2014  Time:  0915am  Type of Therapy:  Nurse Education  Participation Level:  Active  Participation Quality:  Appropriate and Attentive  Affect:  Appropriate  Cognitive:  Alert and Appropriate  Insight:  Appropriate  Engagement in Group:  Engaged  Modes of Intervention:  Discussion, Education and Support  Summary of Progress/Problems: Patient attended group, remained engaged, and responded appropriately when prompted.  Lendell CapriceGuthrie, Curstin Schmale A 12/11/2014, 10:35 AM

## 2014-12-11 NOTE — [facility] Group Notes (Deleted)
[facility] LCSW Group Therapy 12/11/2014 1:15 PM Type of Therapy: Group Therapy Participation Level: Active  Participation Quality: Attentive, Sharing and Supportive  Affect: Appropriate  Cognitive: Alert and Oriented  Insight: Developing/Improving and Engaged  Engagement in Therapy: Developing/Improving and Engaged  Modes of Intervention: Clarification, Confrontation, Discussion, Education, Exploration, Limit-setting, Orientation, Problem-solving, Rapport Building, Dance movement psychotherapisteality Testing, Socialization and Support  Summary of Progress/Problems: The topic for today was feelings about relapse. Pt discussed what relapse prevention is to them and identified triggers that they are on the path to relapse. Pt processed their feeling towards relapse and was able to relate to peers. Pt discussed coping skills that can be used for relapse prevention.    Samuella BruinKristin Dymond Spreen, MSW, [employer]CSWA Clinical Social Worker [facility] [phone]

## 2014-12-12 NOTE — Progress Notes (Signed)
  [facility]  Will you be returning to the same living situation after discharge:  Yes,  Patient continues to be homeless, was provided with listing of local shelters At discharge, do you have transportation home?: Yes,  Patient reports access to transportation by his girlfriend's son Do you have the ability to pay for your medications: Yes,  patient will be provided with medication samples and prescriptions  Release of information consent forms completed and in the chart;  Patient's signature needed at discharge.  Patient to Follow up at: Follow-up Information    Follow up with [facility] On 12/21/2014.   Why:  Therapy appt with Charlott RakesBrian Tubbs on Friday June 17th at 1pm- ask about trauma specific therapy. Medication management appt with Melvyn NethFrank Dolan at 2 pm. Please ask Homero FellersFrank about continued Klonopin use. Call office if need to reschedule.   Contact information:   [address],  [place], [place] [id number]  Phone: [phone] Fax: [phone]       Patient denies SI/HI: Yes,  denies    Safety Planning and Suicide Prevention discussed: Yes,  with patient     Has patient been referred to the Quitline?: Yes, faxed on 12/11/14.  Meir Elwood, [place] CarboKristin L 12/12/2014, 7:43 AM

## 2015-08-04 ENCOUNTER — Emergency Department (HOSPITAL_COMMUNITY)
Admission: EM | Admit: 2015-08-04 | Discharge: 2015-08-04 | Disposition: A | Discharge status: Home / Self Care | Payer: Self-pay | Attending: Emergency Medicine | Admitting: Emergency Medicine

## 2015-08-04 ENCOUNTER — Emergency Department (HOSPITAL_COMMUNITY): Payer: Self-pay

## 2015-08-04 ENCOUNTER — Encounter (HOSPITAL_COMMUNITY): Payer: Self-pay | Admitting: Emergency Medicine

## 2015-08-04 DIAGNOSIS — R05 Cough: Secondary | ICD-10-CM | POA: Insufficient documentation

## 2015-08-04 DIAGNOSIS — R000 Tachycardia, unspecified: Secondary | ICD-10-CM | POA: Insufficient documentation

## 2015-08-04 DIAGNOSIS — F419 Anxiety disorder, unspecified: Secondary | ICD-10-CM | POA: Insufficient documentation

## 2015-08-04 DIAGNOSIS — F17210 Nicotine dependence, cigarettes, uncomplicated: Secondary | ICD-10-CM | POA: Insufficient documentation

## 2015-08-04 DIAGNOSIS — F4310 Post-traumatic stress disorder, unspecified: Secondary | ICD-10-CM | POA: Insufficient documentation

## 2015-08-04 DIAGNOSIS — F1010 Alcohol abuse, uncomplicated: Secondary | ICD-10-CM | POA: Insufficient documentation

## 2015-08-04 DIAGNOSIS — Z79899 Other long term (current) drug therapy: Secondary | ICD-10-CM | POA: Insufficient documentation

## 2015-08-04 DIAGNOSIS — Z87828 Personal history of other (healed) physical injury and trauma: Secondary | ICD-10-CM | POA: Insufficient documentation

## 2015-08-04 DIAGNOSIS — F319 Bipolar disorder, unspecified: Secondary | ICD-10-CM | POA: Insufficient documentation

## 2015-08-04 DIAGNOSIS — Z880 Allergy status to penicillin: Secondary | ICD-10-CM | POA: Insufficient documentation

## 2015-08-04 DIAGNOSIS — R451 Restlessness and agitation: Secondary | ICD-10-CM | POA: Insufficient documentation

## 2015-08-04 DIAGNOSIS — R0789 Other chest pain: Secondary | ICD-10-CM | POA: Insufficient documentation

## 2015-08-04 DIAGNOSIS — G8929 Other chronic pain: Secondary | ICD-10-CM | POA: Insufficient documentation

## 2015-08-04 LAB — BASIC METABOLIC PANEL
Anion gap: 9 (ref 5–15)
BUN: 12 mg/dL (ref 6–20)
CALCIUM: 9.3 mg/dL (ref 8.9–10.3)
CO2: 28 mmol/L (ref 22–32)
CREATININE: 0.98 mg/dL (ref 0.61–1.24)
Chloride: 105 mmol/L (ref 101–111)
GFR calc Af Amer: >60 mL/min (ref >60)
GFR calc non Af Amer: >60 mL/min (ref >60)
GLUCOSE: 113 mg/dL — AB (ref 65–99)
Potassium: 3.9 mmol/L (ref 3.5–5.1)
Sodium: 142 mmol/L (ref 135–145)

## 2015-08-04 LAB — CBC
HEMATOCRIT: 47.7 % (ref 39.0–52.0)
Hemoglobin: 16.2 g/dL (ref 13.0–17.0)
MCH: 31.5 pg (ref 26.0–34.0)
MCHC: 34 g/dL (ref 30.0–36.0)
MCV: 92.8 fL (ref 78.0–100.0)
Platelets: 330 10*3/uL (ref 150–400)
RBC: 5.14 MIL/uL (ref 4.22–5.81)
RDW: 12.8 % (ref 11.5–15.5)
WBC: 9.8 10*3/uL (ref 4.0–10.5)

## 2015-08-04 LAB — I-STAT TROPONIN, ED: Troponin i, poc: 0 ng/mL (ref 0.00–0.08)

## 2015-08-04 MED ORDER — ALBUTEROL SULFATE HFA 108 (90 BASE) MCG/ACT IN AERS
2.0000 | INHALATION_SPRAY | Freq: Once | RESPIRATORY_TRACT | Status: AC
  Administered 2015-08-04: 2 via RESPIRATORY_TRACT
  Filled 2015-08-04: qty 6.7

## 2015-08-04 MED ORDER — LORAZEPAM 1 MG PO TABS
1.0000 mg | ORAL_TABLET | Freq: Once | ORAL | Status: AC
  Administered 2015-08-04: 1 mg via ORAL
  Filled 2015-08-04: qty 1

## 2015-08-04 MED ORDER — CHLORDIAZEPOXIDE HCL 25 MG PO CAPS: ORAL_CAPSULE | ORAL | Status: AC

## 2015-08-04 NOTE — ED Notes (Signed)
C/o sharp intermittent chest pain x 4 days with sob and shaking all over.  Pt reports history of PTSD and severe anxiety but states this is different.  Taking co-workers xanax without relief and states he has been really intoxicated for the past 2 days.  Increased stress over issues with trying to get his daughter that he hasn't seen in 7 yrs.

## 2015-08-04 NOTE — Discharge Instructions (Signed)
If you were given medicines take as directed.  If you are on coumadin or contraceptives realize their levels and effectiveness is altered by many different medicines.  If you have any reaction (rash, tongues swelling, other) to the medicines stop taking and see a physician.    If your blood pressure was elevated in the ER make sure you follow up for management with a primary doctor or return for chest pain, shortness of breath or stroke symptoms.  Please follow up as directed and return to the ER or see a physician for new or worsening symptoms.  Thank you. Filed Vitals:   08/04/15 2035  BP: 148/98  Pulse: 98  Temp: 98.7 F (37.1 C)  TempSrc: Oral  Resp: 24  SpO2: 99%    Emergency Department Resource Guide 1) Find a Doctor and Pay Out of Pocket Although you won't have to find out who is covered by your insurance plan, it is a good idea to ask around and get recommendations. You will then need to call the office and see if the doctor you have chosen will accept you as a new patient and what types of options they offer for patients who are self-pay. Some doctors offer discounts or will set up payment plans for their patients who do not have insurance, but you will need to ask so you aren't surprised when you get to your appointment.  2) Contact Your Local Health Department Not all health departments have doctors that can see patients for sick visits, but many do, so it is worth a call to see if yours does. If you don't know where your local health department is, you can check in your phone book. The CDC also has a tool to help you locate your state's health department, and many state websites also have listings of all of their local health departments.  3) Find a Walk-in Clinic If your illness is not likely to be very severe or complicated, you may want to try a walk in clinic. These are popping up all over the country in pharmacies, drugstores, and shopping centers. They're usually staffed by  nurse practitioners or physician assistants that have been trained to treat common illnesses and complaints. They're usually fairly quick and inexpensive. However, if you have serious medical issues or chronic medical problems, these are probably not your best option.  No Primary Care Doctor: - Call Health Connect at  [phone] - they can help you locate a primary care doctor that  accepts your insurance, provides certain services, etc. - Physician Referral Service- [phone]  Chronic Pain Problems: Organization         Address  Phone   Notes  [facility]  [phone] Patients need to be referred by their primary care doctor.   Medication Assistance: Organization         Address  Phone   Notes  [facility] [address] [place] [place], [place] [address] [phone] --Must be a resident of [facility] -- Must have NO insurance coverage whatsoever (no Medicaid/ Medicare, etc.) -- The pt. MUST have a primary care doctor that directs their care regularly and follows them in the community   [facility]ist  [phone]   [employer]  [phone]    Agencies that provide inexpensive medical care: Organization         Address  Phone   Notes  [facility]  [phone]   Patrcia Dolly  [facility]    [phone]   [facility] [address] [place], [place] [address] [phone]   [facility] [place] [address], [place] [phone]   [facility]anned Parenthood    [phone]   [facility]    [phone]) [phone]   [facility] and [facility]  [address], [place] Phone:  [phone](336) [phone], Fax:  [phone](336) [phone] Hours of Operation:  9 am - 6 pm, M-F.  Also accepts Medicaid/Medicare and self-pay.  [facility]  [address], [place] Phone: [phone], Fax: [phone]. Hours of Operation:  8:30  am - 5:30 pm, M-F.  Also accepts Medicaid and self-pay.  [facility] [place] [address], [place] Phone: [phone]) [phone]   [facility] [address], [place], [place] [phone], Ext. [phone] Mondays & Thursdays: 7-9 AM.  First 15 patients are seen on a first come, first serve basis.    Medicaid-accepting [facility] Providers:  Organization         Address  Phone   Notes  [facility] [address], [place] [phone]) [phone] Also accepts self-pay patients.  [facility] [address], [place]  [phone]) [phone]   [facility] [address], [place] [phone]) [phone]   [facility] [address] [place] [address], [place] [phone]) [phone]   Renaye RakersVeita Bland [address], [place]   [phone] Only accepts [place] Access [place] patients after they have their name applied to their card.   Self-Pay (no insurance) in [facility]:  Organization         Address  Phone   Notes  Sickle Cell Patients, [facility] [address], [place] [phone]) [phone]   [facility] [address], [place] [phone]) [phone]   [facility]  [address], [place] [phone]) [phone]   [facility]/Dr. Osei-Bonsu  [address] [place] [address], [place] or [phone], [place] [phone] Phone number for both [place] and [place] locations is the same.  [facility] and [facility] [address], [place] [phone]) [phone]   [facility] [place] [address] [place] [address], [place] or [address] [phone]) [phone]   [facility] [address], [place] [phone], phone; [phone]) [phone], fax Sees patients 1st and 3rd Saturday of every month.  Must not qualify for public or private insurance (i.e. Medicaid, Medicare, [place] Health  Choice, Veterans' Benefits)  Household income should be no more than 200% of the poverty level The clinic cannot treat you if you are pregnant or think you are pregnant  Sexually transmitted diseases are not treated at the clinic.    Dental Care: Organization         Address  Phone  Notes  [facility] Department of [facility] [address], [place] [phone] Accepts children up to age 36 who are enrolled in [place] or [place] Health Choice; pregnant women with a Medicaid card; and children who have applied for Medicaid or [place] Health Choice, but were declined, whose parents can pay a reduced fee at time of service.  [facility] of [facility] [place]  [address], [place] [phone] Accepts children up to age 58 who are enrolled in Medicaid or [place] Health Choice; pregnant women with a Medicaid card; and children who have applied for Medicaid or [place] Health Choice, but were declined, whose parents can pay a reduced fee at time of service.  [facility]  [address] [place], [place] [phone] Patients are seen by appointment only. Walk-ins are not accepted. [facility] will see patients 76 years of age and older. Monday - Tuesday (8am-5pm) Most Wednesdays (8:30-5pm) $30 per visit, cash only  [facility] Access PROGRAM  [address], [facility] [phone] Patients are seen by appointment only. Walk-ins are not accepted. [facility] will see patients 70 years of age and older. One Wednesday Evening (Monthly: Volunteer Based).  $30 per visit, cash only  [employer]  [phone] for adults; Children under age 44, call Graduate Pediatric Dentistry at [phone]. Children aged 65-14, please call [phone] to request a pediatric application.  Dental services are provided in all areas of dental care including fillings, crowns and bridges, complete  and partial dentures, implants, gum treatment, root canals, and extractions. Preventive care is also provided. Treatment is provided to both adults and children. Patients are selected via a lottery and there is often a waiting list.   [facility] [address], [place]  [phone] [email or web address]   [facility] [address] [place], [place] [phone], Ext. [phone] Second and Fourth Thursday of each month, opens at 6:30 AM; Clinic ends at 9 AM.  Patients are seen on a first-come first-served basis, and a limited number are seen during each clinic.   [facility]  [address] [place], [place] [phone]   Eligibility Requirements You must have lived in [place], [place], or [place] counties for at least the last three months.   You cannot be eligible for state or federal sponsored [place], including [employer], [place], or [employer].   You generally cannot be eligible for healthcare insurance through your employer.    How to apply: Eligibility screenings are held every Tuesday and Wednesday afternoon from 1:00 pm until 4:00 pm. You do not need an appointment for the interview!  [employer] [address] [place], [place] [phone]   [facility] Health Department  [phone]   [facility] Health Department  [phone]   [facility] Health Department  [phone]    Behavioral Health Resources in the Community: Intensive Outpatient Programs Organization         Address  Phone  Notes  [facility] [address], [place], [place] [phone]   [facility]patient [address], [place], [place] [phone]   [facility]: [facility] [address], [place], [place]  [phone]   [facility] [address],  [place], [place] [phone] or [phone]   Substance Abuse Resources Organization          Address  Phone  Notes  Alcohol and Drug Services  [phone]   [facility]  [phone]   [place]  [phone]   Floydene Flock  [phone]   Residential & Outpatient Substance Abuse Program  [phone]   Psychological Services Organization         Address  Phone  Notes  [facility]  [phone]   [facility]  [phone]  [facility] [address], [place] [phone] or [phone]    Mobile Crisis Teams Organization         Address  Phone  Notes  [facility], Mobile Crisis Care Unit  [phone]   [facility]  [address] [place], [place] [phone]   Doristine Locks [address] [place] [phone]    Self-Help/Support Groups Organization         Address  Phone             Notes  [facility] [place] - variety of support groups  [phone]- I7437963 Call for more information  Narcotics Anonymous (NA), [facility] [address], [employer] [place]  2 meetings at this location   Statistician         Address  Phone  Notes  [facility] [address],    [place]  [phone]   [facility]  [address] [place] [address], [place], [place] [phone]   [facility] [address] [place], [place] [phone] Admissions: 8am-3pm M-F  [facility] [address],    [place], [place] [phone]   [facility] [address] [place], [place], [place] [phone]   [facility] [address]  [place], [place] [phone]   [facility] - Intensive Outpatient [address], [place], [place] [phone]   [facility] ([facility] [address] [place].,  [place], [place] [phone] or [phone]   [facility] ([facility]) [address], [place], [place]  [phone] Accepts Medicaid  [facility] [place] [address],  [place] [phone] Substance Abuse/Addiction Treatment   [facility] Organization         Address  Phone  Notes  [facility]  [phone]   Angie Fava, PhD [address] [place], [place]   [phone] or [phone]   [facility]   [address] [place], [place] [phone]   [facility] [address] [place], [place] [phone] Insurance/Medicaid/sponsorship through [facility] and Families [address]                                    [place], [place] [phone] Therapy/tele-psych/case  [facility] [address], [place] [phone]    Dr. Lolly Mustache  [phone]   [facility]  [employer] [facility] Dept. 1) [address] [place] 2) [address] [place] 3)  [address], [place] [phone]  [phone]   [facility] Child Abuse Hotline [phone] or 6607110662 (After Hours)
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
